# Patient Record
Sex: Male | Born: 1965 | Race: White | Hispanic: No | Marital: Married | Smoking: Former smoker
Health system: Southern US, Community
[De-identification: ages and names within clinical notes are randomized; demographics above are authoritative.]

## PROBLEM LIST (undated history)

## (undated) DIAGNOSIS — N200 Calculus of kidney: Secondary | ICD-10-CM

## (undated) DIAGNOSIS — E119 Type 2 diabetes mellitus without complications: Secondary | ICD-10-CM

## (undated) DIAGNOSIS — K922 Gastrointestinal hemorrhage, unspecified: Secondary | ICD-10-CM

## (undated) DIAGNOSIS — G40909 Epilepsy, unspecified, not intractable, without status epilepticus: Secondary | ICD-10-CM

## (undated) HISTORY — PX: HAND SURGERY: SHX662

## (undated) HISTORY — PX: OTHER SURGICAL HISTORY: SHX169

## (undated) HISTORY — PX: KNEE ARTHROSCOPY: SHX127

---

## 2011-10-01 ENCOUNTER — Emergency Department (HOSPITAL_COMMUNITY)
Admission: EM | Admit: 2011-10-01 | Discharge: 2011-10-01 | Disposition: A | Discharge status: Home / Self Care | Payer: BC Managed Care – PPO | Attending: Emergency Medicine | Admitting: Emergency Medicine

## 2011-10-01 ENCOUNTER — Encounter (HOSPITAL_COMMUNITY): Payer: Self-pay

## 2011-10-01 DIAGNOSIS — M722 Plantar fascial fibromatosis: Secondary | ICD-10-CM | POA: Insufficient documentation

## 2011-10-01 DIAGNOSIS — G40909 Epilepsy, unspecified, not intractable, without status epilepticus: Secondary | ICD-10-CM | POA: Insufficient documentation

## 2011-10-01 HISTORY — DX: Epilepsy, unspecified, not intractable, without status epilepticus: G40.909

## 2011-10-01 NOTE — ED Provider Notes (Signed)
History   This chart was scribed for Hurman Horn, MD scribed by Magnus Sinning. The patient was seen in room STRE6/STRE6 seen at 17:50    CSN: [id number]  Arrival date & time 10/01/11  1653   None     Chief Complaint  Patient presents with  . Foot Pain    (Consider location/radiation/quality/duration/timing/severity/associated sxs/prior treatment) HPI Rodney Steele is a 46 y.o. male who presents to the Emergency Department complaining of localized gradual onset constant left heel foot pain, onset 2 weeks. Pt says the pain is usually initiated after several steps and aggravated when going down steps.  Explains he was mowing the lawn when he felt pain and denies trauma, recent injuries, or previous foot,knee, or calf  pain. Also denies CP, abd pain, back pain, fevers, and n/v/d. NO weak/numb/swell/rash. Past Medical History  Diagnosis Date  . Epilepsy     History reviewed. No pertinent past surgical history.  No family history on file.  History  Substance Use Topics  . Smoking status: Never Smoker   . Smokeless tobacco: Not on file  . Alcohol Use: No      Review of Systems  Constitutional: Negative for fever.       10 Systems reviewed and are negative for acute change except as noted in the HPI.  HENT: Negative for congestion.   Eyes: Negative for discharge and redness.  Respiratory: Negative for cough and shortness of breath.   Cardiovascular: Negative for chest pain.  Gastrointestinal: Negative for vomiting and abdominal pain.  Musculoskeletal: Negative for back pain.       Foot pain  Skin: Negative for rash.  Neurological: Negative for syncope, numbness and headaches.  Psychiatric/Behavioral:       No behavior change.  All other systems reviewed and are negative.    Allergies  Penicillins  Home Medications   Current Outpatient Rx  Name Route Sig Dispense Refill  . BUSPIRONE HCL 10 MG PO TABS Oral Take 10 mg by mouth 2 (two) times daily.    .  TOPIRAMATE 100 MG PO TABS Oral Take 100 mg by mouth 2 (two) times daily.      BP 126/86  Pulse 81  Temp 98.3 F (36.8 C)  SpO2 100%  Physical Exam  Nursing note and vitals reviewed. Constitutional:       Awake, alert, nontoxic appearance.  HENT:  Head: Atraumatic.  Eyes: Right eye exhibits no discharge. Left eye exhibits no discharge.  Neck: Neck supple.  Pulmonary/Chest: Effort normal. He exhibits no tenderness.  Abdominal: Soft. There is no tenderness. There is no rebound.  Musculoskeletal: He exhibits no tenderness.       Baseline ROM, no obvious new focal weakness. Ankle non tender. Achilles tendon and dorsal mid foot are non tender. Along plantar fascia is diffusely tender without overlying erythema or induration.   Neurological:       Mental status and motor strength appears baseline for patient and situation.  Skin: No rash noted.       CR < 2 secs Intact dorsalis pedis.    Psychiatric: He has a normal mood and affect.    ED Course  Procedures (including critical care time) DIAGNOSTIC STUDIES: Oxygen Saturation is 100% on room air, normal by my interpretation.    COORDINATION OF CARE:  Labs Reviewed - No data to display No results found.   1. Plantar fasciitis of left foot       MDM   I personally performed the  services described in this documentation, which was scribed in my presence. The recorded information has been reviewed and considered. Pt stable in ED with no significant deterioration in condition.Patient / Family / Caregiver informed of clinical course, understand medical decision-making process, and agree with plan.I doubt any other EMC precluding discharge at this time including, but not necessarily limited to the following:SBI.     Hurman Horn, MD 10/02/11 [phone]

## 2011-10-01 NOTE — ED Notes (Signed)
Went to find pt. He is In Pod 1 with his wife.

## 2011-10-01 NOTE — ED Notes (Signed)
Lt. Heel pain began about 2 weeks ago, denies any injuries.  Pt. Reports working too many hours.

## 2011-10-01 NOTE — ED Notes (Addendum)
Patient states he started to have pain in his left foot when moving yard and pain is intermittent. 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 Pedal pulse WNL, skin warm and dry to touch. Pain located in arch of left foot.

## 2011-10-01 NOTE — Discharge Instructions (Signed)
Plantar Fasciitis Plantar fasciitis is a common condition that causes foot pain. It is soreness (inflammation) of the band of tough fibrous tissue on the bottom of the foot that runs from the heel bone (calcaneus) to the ball of the foot. The cause of this soreness may be from excessive standing, poor fitting shoes, running on hard surfaces, being overweight, having an abnormal walk, or overuse (this is common in runners) of the painful foot or feet. It is also common in aerobic exercise dancers and ballet dancers. SYMPTOMS  Most people with plantar fasciitis complain of:  Severe pain in the morning on the bottom of their foot especially when taking the first steps out of bed. This pain recedes after a few minutes of walking.   Severe pain is experienced also during walking following a long period of inactivity.   Pain is worse when walking barefoot or up stairs  DIAGNOSIS   Your caregiver will diagnose this condition by examining and feeling your foot.   Special tests such as X-rays of your foot, are usually not needed.  PREVENTION   Consult a sports medicine professional before beginning a new exercise program.   Walking programs offer a good workout. With walking there is a lower chance of overuse injuries common to runners. There is less impact and less jarring of the joints.   Begin all new exercise programs slowly. If problems or pain develop, decrease the amount of time or distance until you are at a comfortable level.   Wear good shoes and replace them regularly.   Stretch your foot and the heel cords at the back of the ankle (Achilles tendon) both before and after exercise.   Run or exercise on even surfaces that are not hard. For example, asphalt is better than pavement.   Do not run barefoot on hard surfaces.   If using a treadmill, vary the incline.   Do not continue to workout if you have foot or joint problems. Seek professional help if they do not improve.  HOME CARE  INSTRUCTIONS   Avoid activities that cause you pain until you recover.   Use ice or cold packs on the problem or painful areas after working out.   Only take over-the-counter or prescription medicines for pain, discomfort, or fever as directed by your caregiver.   Soft shoe inserts or athletic shoes with air or gel sole cushions may be helpful.   If problems continue or become more severe, consult a sports medicine caregiver or your own health care provider. Cortisone is a potent anti-inflammatory medication that may be injected into the painful area. You can discuss this treatment with your caregiver.  MAKE SURE YOU:   Understand these instructions.   Will watch your condition.   Will get help right away if you are not doing well or get worse.  Document Released: 02/01/2001 Document Revised: 04/28/2011 Document Reviewed: 04/02/2008 ExitCare Patient Information 2012 ExitCare,   Return sooner if you develop fever, redness, pus drainage, weakness, numbness, or other concerns.

## 2013-11-05 DIAGNOSIS — E119 Type 2 diabetes mellitus without complications: Secondary | ICD-10-CM

## 2013-11-05 HISTORY — DX: Type 2 diabetes mellitus without complications: E11.9

## 2013-11-06 ENCOUNTER — Emergency Department (HOSPITAL_COMMUNITY): Payer: Self-pay

## 2013-11-06 ENCOUNTER — Inpatient Hospital Stay (HOSPITAL_COMMUNITY)
Admission: EM | Admit: 2013-11-06 | Discharge: 2013-11-08 | DRG: 694 | Disposition: A | Discharge status: Home / Self Care | Payer: BC Managed Care – PPO | Attending: Internal Medicine | Admitting: Internal Medicine

## 2013-11-06 ENCOUNTER — Encounter (HOSPITAL_COMMUNITY): Payer: Self-pay | Admitting: Emergency Medicine

## 2013-11-06 DIAGNOSIS — Z91199 Patient's noncompliance with other medical treatment and regimen due to unspecified reason: Secondary | ICD-10-CM

## 2013-11-06 DIAGNOSIS — Z9119 Patient's noncompliance with other medical treatment and regimen: Secondary | ICD-10-CM

## 2013-11-06 DIAGNOSIS — R739 Hyperglycemia, unspecified: Secondary | ICD-10-CM | POA: Diagnosis present

## 2013-11-06 DIAGNOSIS — K625 Hemorrhage of anus and rectum: Secondary | ICD-10-CM | POA: Diagnosis present

## 2013-11-06 DIAGNOSIS — R569 Unspecified convulsions: Secondary | ICD-10-CM | POA: Diagnosis present

## 2013-11-06 DIAGNOSIS — N189 Chronic kidney disease, unspecified: Secondary | ICD-10-CM | POA: Diagnosis present

## 2013-11-06 DIAGNOSIS — Z5987 Material hardship due to limited financial resources, not elsewhere classified: Secondary | ICD-10-CM

## 2013-11-06 DIAGNOSIS — E119 Type 2 diabetes mellitus without complications: Secondary | ICD-10-CM | POA: Diagnosis present

## 2013-11-06 DIAGNOSIS — Z5309 Procedure and treatment not carried out because of other contraindication: Secondary | ICD-10-CM

## 2013-11-06 DIAGNOSIS — Q638 Other specified congenital malformations of kidney: Secondary | ICD-10-CM

## 2013-11-06 DIAGNOSIS — Z87442 Personal history of urinary calculi: Secondary | ICD-10-CM

## 2013-11-06 DIAGNOSIS — Z598 Other problems related to housing and economic circumstances: Secondary | ICD-10-CM

## 2013-11-06 DIAGNOSIS — Z79899 Other long term (current) drug therapy: Secondary | ICD-10-CM

## 2013-11-06 DIAGNOSIS — N132 Hydronephrosis with renal and ureteral calculous obstruction: Secondary | ICD-10-CM

## 2013-11-06 DIAGNOSIS — N201 Calculus of ureter: Principal | ICD-10-CM | POA: Diagnosis present

## 2013-11-06 DIAGNOSIS — N200 Calculus of kidney: Secondary | ICD-10-CM

## 2013-11-06 DIAGNOSIS — Q631 Lobulated, fused and horseshoe kidney: Secondary | ICD-10-CM

## 2013-11-06 DIAGNOSIS — R7309 Other abnormal glucose: Secondary | ICD-10-CM

## 2013-11-06 DIAGNOSIS — K922 Gastrointestinal hemorrhage, unspecified: Secondary | ICD-10-CM | POA: Diagnosis present

## 2013-11-06 DIAGNOSIS — D696 Thrombocytopenia, unspecified: Secondary | ICD-10-CM | POA: Diagnosis present

## 2013-11-06 DIAGNOSIS — Z87891 Personal history of nicotine dependence: Secondary | ICD-10-CM

## 2013-11-06 DIAGNOSIS — G40909 Epilepsy, unspecified, not intractable, without status epilepticus: Secondary | ICD-10-CM | POA: Diagnosis present

## 2013-11-06 DIAGNOSIS — N1330 Unspecified hydronephrosis: Secondary | ICD-10-CM | POA: Diagnosis present

## 2013-11-06 HISTORY — DX: Calculus of kidney: N20.0

## 2013-11-06 HISTORY — DX: Gastrointestinal hemorrhage, unspecified: K92.2

## 2013-11-06 HISTORY — DX: Type 2 diabetes mellitus without complications: E11.9

## 2013-11-06 LAB — BASIC METABOLIC PANEL
BUN: 18 mg/dL (ref 6–23)
CALCIUM: 8.5 mg/dL (ref 8.4–10.5)
CO2: 18 meq/L — AB (ref 19–32)
CREATININE: 1.16 mg/dL (ref 0.50–1.35)
Chloride: 105 mEq/L (ref 96–112)
GFR calc Af Amer: 86 mL/min — ABNORMAL LOW (ref >90)
GFR calc non Af Amer: 74 mL/min — ABNORMAL LOW (ref >90)
GLUCOSE: 117 mg/dL — AB (ref 70–99)
Potassium: 4.5 mEq/L (ref 3.7–5.3)
Sodium: 139 mEq/L (ref 137–147)

## 2013-11-06 LAB — COMPREHENSIVE METABOLIC PANEL
ALK PHOS: 67 U/L (ref 39–117)
ALT: 53 U/L (ref 0–53)
ALT: 50 U/L (ref 0–53)
AST: 34 U/L (ref 0–37)
AST: 126 U/L — AB (ref 0–37)
Albumin: 4.3 g/dL (ref 3.5–5.2)
Albumin: 3.5 g/dL (ref 3.5–5.2)
Alkaline Phosphatase: 101 U/L (ref 39–117)
BILIRUBIN TOTAL: 0.3 mg/dL (ref 0.3–1.2)
BUN: 17 mg/dL (ref 6–23)
BUN: 8 mg/dL (ref 6–23)
CALCIUM: 9.8 mg/dL (ref 8.4–10.5)
CALCIUM: 8.7 mg/dL (ref 8.4–10.5)
CHLORIDE: 96 meq/L (ref 96–112)
CO2: 26 meq/L (ref 19–32)
CO2: 24 meq/L (ref 19–32)
CREATININE: 1.04 mg/dL (ref 0.50–1.35)
Chloride: 88 mEq/L — ABNORMAL LOW (ref 96–112)
Creatinine, Ser: 0.56 mg/dL (ref 0.50–1.35)
GFR, EST NON AFRICAN AMERICAN: 84 mL/min — AB (ref >90)
GLUCOSE: 312 mg/dL — AB (ref 70–99)
GLUCOSE: 141 mg/dL — AB (ref 70–99)
POTASSIUM: 3.8 meq/L (ref 3.7–5.3)
Potassium: 4.3 mEq/L (ref 3.7–5.3)
SODIUM: 135 meq/L — AB (ref 137–147)
SODIUM: 127 meq/L — AB (ref 137–147)
Total Bilirubin: 1 mg/dL (ref 0.3–1.2)
Total Protein: 8 g/dL (ref 6.0–8.3)
Total Protein: 7.2 g/dL (ref 6.0–8.3)

## 2013-11-06 LAB — I-STAT CHEM 8, ED
BUN: 18 mg/dL (ref 6–23)
CALCIUM ION: 1.2 mmol/L (ref 1.12–1.23)
CHLORIDE: 103 meq/L (ref 96–112)
Creatinine, Ser: 1.2 mg/dL (ref 0.50–1.35)
GLUCOSE: 307 mg/dL — AB (ref 70–99)
HEMATOCRIT: 52 % (ref 39.0–52.0)
HEMOGLOBIN: 17.7 g/dL — AB (ref 13.0–17.0)
Potassium: 3.8 mEq/L (ref 3.7–5.3)
Sodium: 137 mEq/L (ref 137–147)
TCO2: 25 mmol/L (ref 0–100)

## 2013-11-06 LAB — URINALYSIS, ROUTINE W REFLEX MICROSCOPIC
Bilirubin Urine: NEGATIVE
Glucose, UA: >1000 mg/dL — AB
Hgb urine dipstick: NEGATIVE
Ketones, ur: NEGATIVE mg/dL
Leukocytes, UA: NEGATIVE
Nitrite: NEGATIVE
Protein, ur: NEGATIVE mg/dL
Specific Gravity, Urine: 1.035 — ABNORMAL HIGH (ref 1.005–1.030)
Urobilinogen, UA: 0.2 mg/dL (ref 0.0–1.0)
pH: 6 (ref 5.0–8.0)

## 2013-11-06 LAB — GLUCOSE, CAPILLARY
GLUCOSE-CAPILLARY: 253 mg/dL — AB (ref 70–99)
Glucose-Capillary: 118 mg/dL — ABNORMAL HIGH (ref 70–99)
Glucose-Capillary: 124 mg/dL — ABNORMAL HIGH (ref 70–99)
Glucose-Capillary: 156 mg/dL — ABNORMAL HIGH (ref 70–99)
Glucose-Capillary: 173 mg/dL — ABNORMAL HIGH (ref 70–99)

## 2013-11-06 LAB — CBC
HCT: 43.1 % (ref 39.0–52.0)
HCT: 43 % (ref 39.0–52.0)
HEMATOCRIT: 42 % (ref 39.0–52.0)
Hemoglobin: 14.6 g/dL (ref 13.0–17.0)
Hemoglobin: 14.2 g/dL (ref 13.0–17.0)
Hemoglobin: 14.7 g/dL (ref 13.0–17.0)
MCH: 27.4 pg (ref 26.0–34.0)
MCH: 27.4 pg (ref 26.0–34.0)
MCH: 27.6 pg (ref 26.0–34.0)
MCHC: 33.9 g/dL (ref 30.0–36.0)
MCHC: 33.8 g/dL (ref 30.0–36.0)
MCHC: 34.2 g/dL (ref 30.0–36.0)
MCV: 81 fL (ref 78.0–100.0)
MCV: 80.9 fL (ref 78.0–100.0)
MCV: 80.8 fL (ref 78.0–100.0)
PLATELETS: 102 10*3/uL — AB (ref 150–400)
PLATELETS: 106 10*3/uL — AB (ref 150–400)
Platelets: 97 10*3/uL — ABNORMAL LOW (ref 150–400)
RBC: 5.32 MIL/uL (ref 4.22–5.81)
RBC: 5.19 MIL/uL (ref 4.22–5.81)
RBC: 5.32 MIL/uL (ref 4.22–5.81)
RDW: 12.7 % (ref 11.5–15.5)
RDW: 12.9 % (ref 11.5–15.5)
RDW: 12.9 % (ref 11.5–15.5)
WBC: 7.1 10*3/uL (ref 4.0–10.5)
WBC: 7.6 10*3/uL (ref 4.0–10.5)
WBC: 8 10*3/uL (ref 4.0–10.5)

## 2013-11-06 LAB — CBC WITH DIFFERENTIAL/PLATELET
BASOS ABS: 0 10*3/uL (ref 0.0–0.1)
Basophils Relative: 0 % (ref 0–1)
EOS PCT: 1 % (ref 0–5)
Eosinophils Absolute: 0.1 10*3/uL (ref 0.0–0.7)
HEMATOCRIT: 46.8 % (ref 39.0–52.0)
Hemoglobin: 16.2 g/dL (ref 13.0–17.0)
LYMPHS ABS: 2.6 10*3/uL (ref 0.7–4.0)
LYMPHS PCT: 32 % (ref 12–46)
MCH: 27.9 pg (ref 26.0–34.0)
MCHC: 34.6 g/dL (ref 30.0–36.0)
MCV: 80.7 fL (ref 78.0–100.0)
MONO ABS: 0.6 10*3/uL (ref 0.1–1.0)
Monocytes Relative: 7 % (ref 3–12)
NEUTROS ABS: 4.8 10*3/uL (ref 1.7–7.7)
Neutrophils Relative %: 60 % (ref 43–77)
Platelets: 120 10*3/uL — ABNORMAL LOW (ref 150–400)
RBC: 5.8 MIL/uL (ref 4.22–5.81)
RDW: 12.7 % (ref 11.5–15.5)
WBC: 8.1 10*3/uL (ref 4.0–10.5)

## 2013-11-06 LAB — HEMOGLOBIN A1C
Hgb A1c MFr Bld: 9.3 % — ABNORMAL HIGH (ref <5.7)
Mean Plasma Glucose: 220 mg/dL — ABNORMAL HIGH (ref <117)

## 2013-11-06 LAB — URINE MICROSCOPIC-ADD ON

## 2013-11-06 LAB — SAMPLE TO BLOOD BANK

## 2013-11-06 LAB — TYPE AND SCREEN
ABO/RH(D): O POS
Antibody Screen: NEGATIVE

## 2013-11-06 LAB — ABO/RH: ABO/RH(D): O POS

## 2013-11-06 LAB — PROTIME-INR
INR: 0.91 (ref 0.00–1.49)
PROTHROMBIN TIME: 12.1 s (ref 11.6–15.2)

## 2013-11-06 LAB — CBG MONITORING, ED: Glucose-Capillary: 254 mg/dL — ABNORMAL HIGH (ref 70–99)

## 2013-11-06 LAB — POC OCCULT BLOOD, ED: Fecal Occult Bld: POSITIVE — AB

## 2013-11-06 MED ORDER — KETOROLAC TROMETHAMINE 15 MG/ML IJ SOLN
15.0000 mg | Freq: Four times a day (QID) | INTRAMUSCULAR | Status: DC | PRN
  Administered 2013-11-06 (×2): 15 mg via INTRAVENOUS
  Filled 2013-11-06 (×3): qty 1

## 2013-11-06 MED ORDER — ONDANSETRON HCL 4 MG/2ML IJ SOLN
4.0000 mg | Freq: Four times a day (QID) | INTRAMUSCULAR | Status: DC | PRN
  Administered 2013-11-06: 4 mg via INTRAVENOUS

## 2013-11-06 MED ORDER — SODIUM CHLORIDE 0.9 % IV SOLN
500.0000 mg | Freq: Two times a day (BID) | INTRAVENOUS | Status: DC
  Administered 2013-11-06 – 2013-11-07 (×2): 500 mg via INTRAVENOUS
  Filled 2013-11-06 (×3): qty 5

## 2013-11-06 MED ORDER — MORPHINE SULFATE 2 MG/ML IJ SOLN
2.0000 mg | INTRAMUSCULAR | Status: DC | PRN
  Administered 2013-11-06 – 2013-11-07 (×4): 2 mg via INTRAVENOUS
  Filled 2013-11-06 (×5): qty 1

## 2013-11-06 MED ORDER — PEG 3350-KCL-NA BICARB-NACL 420 G PO SOLR
4000.0000 mL | Freq: Once | ORAL | Status: AC
  Administered 2013-11-06: 4000 mL via ORAL
  Filled 2013-11-06 (×2): qty 4000

## 2013-11-06 MED ORDER — SODIUM CHLORIDE 0.9 % IV BOLUS (SEPSIS)
1000.0000 mL | Freq: Once | INTRAVENOUS | Status: AC
  Administered 2013-11-06: 1000 mL via INTRAVENOUS

## 2013-11-06 MED ORDER — DEXTROSE 5 % IV SOLN
1.0000 g | Freq: Two times a day (BID) | INTRAVENOUS | Status: DC
  Administered 2013-11-06 – 2013-11-07 (×4): 1 g via INTRAVENOUS
  Filled 2013-11-06 (×6): qty 1

## 2013-11-06 MED ORDER — MORPHINE SULFATE 4 MG/ML IJ SOLN
4.0000 mg | Freq: Once | INTRAMUSCULAR | Status: AC
  Administered 2013-11-06: 4 mg via INTRAVENOUS
  Filled 2013-11-06: qty 1

## 2013-11-06 MED ORDER — FENTANYL CITRATE 0.05 MG/ML IJ SOLN
100.0000 ug | Freq: Once | INTRAMUSCULAR | Status: AC
  Administered 2013-11-06: 100 ug via INTRAVENOUS
  Filled 2013-11-06: qty 2

## 2013-11-06 MED ORDER — FENTANYL CITRATE 0.05 MG/ML IJ SOLN
50.0000 ug | Freq: Once | INTRAMUSCULAR | Status: AC
  Administered 2013-11-06: 50 ug via INTRAVENOUS
  Filled 2013-11-06: qty 2

## 2013-11-06 MED ORDER — ONDANSETRON HCL 4 MG/2ML IJ SOLN
4.0000 mg | Freq: Once | INTRAMUSCULAR | Status: AC
  Administered 2013-11-06: 4 mg via INTRAVENOUS
  Filled 2013-11-06: qty 2

## 2013-11-06 MED ORDER — LEVETIRACETAM 500 MG/5ML IV SOLN
1000.0000 mg | Freq: Once | INTRAVENOUS | Status: AC
  Administered 2013-11-06: 1000 mg via INTRAVENOUS
  Filled 2013-11-06: qty 10

## 2013-11-06 MED ORDER — ONDANSETRON HCL 4 MG PO TABS: 4.0000 mg | ORAL_TABLET | Freq: Four times a day (QID) | ORAL | Status: DC | PRN

## 2013-11-06 MED ORDER — ACETAMINOPHEN 650 MG RE SUPP: 650.0000 mg | Freq: Four times a day (QID) | RECTAL | Status: DC | PRN

## 2013-11-06 MED ORDER — CEFTRIAXONE SODIUM 1 G IJ SOLR
1.0000 g | Freq: Once | INTRAMUSCULAR | Status: AC
Start: 2013-11-06 — End: 2013-11-06
  Administered 2013-11-06: 1 g via INTRAVENOUS
  Filled 2013-11-06: qty 10

## 2013-11-06 MED ORDER — IOHEXOL 300 MG/ML  SOLN
100.0000 mL | Freq: Once | INTRAMUSCULAR | Status: AC | PRN
  Administered 2013-11-06: 100 mL via INTRAVENOUS

## 2013-11-06 MED ORDER — KETOROLAC TROMETHAMINE 30 MG/ML IJ SOLN
30.0000 mg | Freq: Once | INTRAMUSCULAR | Status: AC
  Administered 2013-11-06: 30 mg via INTRAVENOUS
  Filled 2013-11-06: qty 1

## 2013-11-06 MED ORDER — PANTOPRAZOLE SODIUM 40 MG IV SOLR
40.0000 mg | Freq: Two times a day (BID) | INTRAVENOUS | Status: DC
Start: 2013-11-06 — End: 2013-11-07
  Administered 2013-11-06 – 2013-11-07 (×3): 40 mg via INTRAVENOUS
  Filled 2013-11-06 (×4): qty 40

## 2013-11-06 MED ORDER — ACETAMINOPHEN 325 MG PO TABS
650.0000 mg | ORAL_TABLET | Freq: Four times a day (QID) | ORAL | Status: DC | PRN
  Filled 2013-11-06: qty 2

## 2013-11-06 MED ORDER — SODIUM CHLORIDE 0.9 % IV SOLN
INTRAVENOUS | Status: AC
  Administered 2013-11-06: 100 mL/h via INTRAVENOUS

## 2013-11-06 MED ORDER — INSULIN ASPART 100 UNIT/ML ~~LOC~~ SOLN
0.0000 [IU] | Freq: Three times a day (TID) | SUBCUTANEOUS | Status: DC
  Administered 2013-11-06: 2 [IU] via SUBCUTANEOUS
  Administered 2013-11-06: 1 [IU] via SUBCUTANEOUS
  Administered 2013-11-06: 5 [IU] via SUBCUTANEOUS
  Administered 2013-11-07: 2 [IU] via SUBCUTANEOUS
  Administered 2013-11-07: 1 [IU] via SUBCUTANEOUS

## 2013-11-06 MED ORDER — SODIUM CHLORIDE 0.9 % IV SOLN: INTRAVENOUS | Status: DC

## 2013-11-06 NOTE — Progress Notes (Signed)
Patient admitted early this AM by Dr. Kirtland BouchardK. Please see H7P.  Await GI eval, urology has seen: Pt will probably need JJ stenting of the Left ureter and kidney Patient with no complaints Currently NPO until we know plan Marlin CanaryJessica Vann DO

## 2013-11-06 NOTE — ED Notes (Signed)
Dr. Nicanor AlconPalumbo explained results of tests and plan of care to pt. and family.

## 2013-11-06 NOTE — ED Provider Notes (Signed)
CSN: [id number]     Arrival date & time 11/06/13  0130 History   First MD Initiated Contact with Patient 11/06/13 0211     Chief Complaint  Patient presents with  . Abdominal Pain  . Rectal Bleeding  . Emesis     (Consider location/radiation/quality/duration/timing/severity/associated sxs/prior Treatment) Patient is a 48 y.o. male presenting with abdominal pain, hematochezia, and vomiting. The history is provided by the patient.  Abdominal Pain Pain location:  L flank Pain quality: aching   Pain radiates to:  Does not radiate Pain severity:  Severe Onset quality:  Sudden Timing:  Constant Progression:  Unchanged Chronicity:  New Context: not alcohol use   Relieved by:  Nothing Worsened by:  Nothing tried Ineffective treatments:  None tried Associated symptoms: hematochezia and vomiting   Associated symptoms: no chest pain, no constipation, no nausea and no shortness of breath   Risk factors: no alcohol abuse   Rectal Bleeding Associated symptoms: abdominal pain and vomiting   Emesis Associated symptoms: abdominal pain     Past Medical History  Diagnosis Date  . Epilepsy   . Kidney stones    History reviewed. No pertinent past surgical history. History reviewed. No pertinent family history. History  Substance Use Topics  . Smoking status: Never Smoker   . Smokeless tobacco: Not on file  . Alcohol Use: No    Review of Systems  Respiratory: Negative for shortness of breath.   Cardiovascular: Negative for chest pain.  Gastrointestinal: Positive for vomiting, abdominal pain, hematochezia and anal bleeding. Negative for nausea and constipation.  All other systems reviewed and are negative.     Allergies  Penicillins  Home Medications   Prior to Admission medications   Medication Sig Start Date End Date Taking? Authorizing Provider  busPIRone (BUSPAR) 10 MG tablet Take 10 mg by mouth 2 (two) times daily.    Historical Provider, MD  topiramate (TOPAMAX) 100  MG tablet Take 100 mg by mouth 2 (two) times daily.    Historical Provider, MD   BP 148/66  Pulse 86  Temp(Src) 99.4 F (37.4 C) (Oral)  Resp 17  SpO2 96% Physical Exam  Constitutional: He is oriented to person, place, and time. He appears well-developed and well-nourished. No distress.  HENT:  Head: Normocephalic and atraumatic.  Mouth/Throat: Oropharynx is clear and moist.  Eyes: Conjunctivae are normal. Pupils are equal, round, and reactive to light.  Neck: Normal range of motion. Neck supple.  Cardiovascular: Normal rate, regular rhythm and intact distal pulses.   Pulmonary/Chest: Effort normal and breath sounds normal. No stridor. He has no wheezes. He has no rales.  Abdominal: Soft. Bowel sounds are normal. He exhibits no ascites. There is generalized tenderness. There is no rigidity, no rebound, no guarding, no tenderness at McBurney's point and negative Murphy's sign.  Genitourinary:  Red blood per rectum  Musculoskeletal: Normal range of motion.  Neurological: He is alert and oriented to person, place, and time.  Skin: Skin is warm and dry.  Psychiatric: He has a normal mood and affect.    ED Course  Procedures (including critical care time) Labs Review Labs Reviewed  CBC WITH DIFFERENTIAL - Abnormal; Notable for the following:    Platelets 120 (*)    All other components within normal limits  I-STAT CHEM 8, ED - Abnormal; Notable for the following:    Glucose, Bld 307 (*)    Hemoglobin 17.7 (*)    All other components within normal limits  COMPREHENSIVE METABOLIC PANEL  PROTIME-INR  URINALYSIS, ROUTINE W REFLEX MICROSCOPIC  SAMPLE TO BLOOD BANK    Imaging Review No results found.   EKG Interpretation None      MDM   Final diagnoses:  None   Kidney stone New onset DM Horseshoe kidney Rectal bleeding  Case d/w Dr. Patsi Searsannenbaum who will see the patient admit to medicine  Medications  levETIRAcetam (KEPPRA) 1,000 mg in sodium chloride 0.9 % 100 mL  IVPB (not administered)  fentaNYL (SUBLIMAZE) injection 50 mcg (50 mcg Intravenous Given 11/06/13 0202)  ondansetron (ZOFRAN) injection 4 mg (4 mg Intravenous Given 11/06/13 0202)  sodium chloride 0.9 % bolus 1,000 mL (0 mLs Intravenous Stopped 11/06/13 0351)  fentaNYL (SUBLIMAZE) injection 100 mcg (100 mcg Intravenous Given 11/06/13 0243)  morphine 4 MG/ML injection 4 mg (4 mg Intravenous Given 11/06/13 0339)  iohexol (OMNIPAQUE) 300 MG/ML solution 100 mL (100 mLs Intravenous Contrast Given 11/06/13 0440)  ketorolac (TORADOL) 30 MG/ML injection 30 mg (30 mg Intravenous Given 11/06/13 0535)     April K Palumbo-Rasch, MD 11/06/13 [phone]

## 2013-11-06 NOTE — Consult Note (Signed)
Urology Consult  Referring physician: Dr. 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 several years ago, now presents to [facility] with acute L flank pain beginning at 11pm last night, associated with nausea and vomiting. Pain was also nted in the L testicle. Pt noted bloody effluent from the rectum, and has been evaluated in [facility] ED, with CT showing Horshoe kidney, with 77mm L UPJ stone, as well as multiple LLP non-obstructing stones. His pain is now controlled, and he is admitted for further evaluation of his rectal bleeding.    Drinks occasional soda. Daily fluids: water and milk. Negative family history for stones.   Past Medical History  Diagnosis Date  . Epilepsy   . Kidney stones    Past Surgical History  Procedure Laterality Date  . Hand surgery      Medications: I have reviewed the patient's current medications.  Allergies:  Allergies  Allergen Reactions  . Penicillins Rash    History reviewed. No pertinent family history.  Social History:  reports that he has never smoked. He does not have any smokeless tobacco history on file. He reports that he does not drink alcohol or use illicit drugs.  ROS: Abdominal Pain  Pain location: L flank Pain quality: aching  Pain radiates to: Does not radiate  Pain severity: Severe  Onset quality: Sudden  Timing: Constant  Progression: Unchanged  Chronicity: New Context: not alcohol use  Relieved by: Nothing  Worsened by: Nothing tried  Ineffective treatments: None tried Associated symptoms: hematochezia and vomiting  Associated symptoms: no chest pain, no constipation, no nausea and no shortness of breath     Physical Exam:  Vital signs in last 24 hours: Temp:  [98.2 F (36.8 C)-99.4 F (37.4 C)] 99.4 F (37.4 C) (06/17 0154) Pulse Rate:  [79-89] 89 (06/17 0643) Resp:  [14-20] 14  (06/17 0643) BP: (119-155)/(66-89) 119/75 mmHg (06/17 0643) SpO2:  [89 %-100 %] 100 % (06/17 0454) Physical Exam: ABD: Obese. Decrease BS. Mild L CVA pain. No LLQ pain. Testes: Descended bilaterally. Non tender. Chest: Cleat to P/A.   Laboratory Data:  Results for orders placed during the hospital encounter of 11/06/13 (from the past 72 hour(s))  CBC WITH DIFFERENTIAL     Status: Abnormal   Collection Time    11/06/13  2:00 AM      Result Value Ref Range   WBC 8.1  4.0 - 10.5 K/uL   RBC 5.80  4.22 - 5.81 MIL/uL   Hemoglobin 16.2  13.0 - 17.0 g/dL   HCT 46.8  39.0 - 52.0 %   MCV 80.7  78.0 - 100.0 fL   MCH 27.9  26.0 - 34.0 pg   MCHC 34.6  30.0 - 36.0 g/dL   RDW 12.7  11.5 - 15.5 %   Platelets 120 (*) 150 - 400 K/uL   Neutrophils Relative % 60  43 - 77 %   Neutro Abs 4.8  1.7 - 7.7 K/uL   Lymphocytes Relative 32  12 - 46 %   Lymphs Abs 2.6  0.7 - 4.0 K/uL   Monocytes Relative 7  3 - 12 %   Monocytes Absolute 0.6  0.1 - 1.0 K/uL   Eosinophils Relative 1  0 - 5 %   Eosinophils Absolute 0.1  0.0 - 0.7 K/uL   Basophils Relative 0  0 -  1 %   Basophils Absolute 0.0  0.0 - 0.1 K/uL  COMPREHENSIVE METABOLIC PANEL     Status: Abnormal   Collection Time    11/06/13  2:00 AM      Result Value Ref Range   Sodium 135 (*) 137 - 147 mEq/L   Potassium 4.3  3.7 - 5.3 mEq/L   Comment: HEMOLYSIS AT THIS LEVEL MAY AFFECT RESULT   Chloride 96  96 - 112 mEq/L   CO2 26  19 - 32 mEq/L   Glucose, Bld 312 (*) 70 - 99 mg/dL   BUN 17  6 - 23 mg/dL   Creatinine, Ser 1.04  0.50 - 1.35 mg/dL   Calcium 9.8  8.4 - 10.5 mg/dL   Total Protein 8.0  6.0 - 8.3 g/dL   Albumin 4.3  3.5 - 5.2 g/dL   AST 34  0 - 37 U/L   ALT 53  0 - 53 U/L   Alkaline Phosphatase 101  39 - 117 U/L   Total Bilirubin 0.3  0.3 - 1.2 mg/dL   GFR calc non Af Amer 84 (*) >90 mL/min   GFR calc Af Amer >90  >90 mL/min   Comment: (NOTE)     The eGFR has been calculated using the CKD EPI equation.     This calculation has not been  validated in all clinical situations.     eGFR's persistently <90 mL/min signify possible Chronic Kidney     Disease.  PROTIME-INR     Status: None   Collection Time    11/06/13  2:00 AM      Result Value Ref Range   Prothrombin Time 12.1  11.6 - 15.2 seconds   INR 0.91  0.00 - 1.49  SAMPLE TO BLOOD BANK     Status: None   Collection Time    11/06/13  2:17 AM      Result Value Ref Range   Blood Bank Specimen SAMPLE AVAILABLE FOR TESTING     Sample Expiration 11/07/2013    I-STAT CHEM 8, ED     Status: Abnormal   Collection Time    11/06/13  2:19 AM      Result Value Ref Range   Sodium 137  137 - 147 mEq/L   Potassium 3.8  3.7 - 5.3 mEq/L   Chloride 103  96 - 112 mEq/L   BUN 18  6 - 23 mg/dL   Creatinine, Ser 1.20  0.50 - 1.35 mg/dL   Glucose, Bld 307 (*) 70 - 99 mg/dL   Calcium, Ion 1.20  1.12 - 1.23 mmol/L   TCO2 25  0 - 100 mmol/L   Hemoglobin 17.7 (*) 13.0 - 17.0 g/dL   HCT 52.0  39.0 - 52.0 %  URINALYSIS, ROUTINE W REFLEX MICROSCOPIC     Status: Abnormal   Collection Time    11/06/13  2:36 AM      Result Value Ref Range   Color, Urine YELLOW  YELLOW   APPearance CLEAR  CLEAR   Specific Gravity, Urine 1.035 (*) 1.005 - 1.030   pH 6.0  5.0 - 8.0   Glucose, UA >1000 (*) NEGATIVE mg/dL   Hgb urine dipstick NEGATIVE  NEGATIVE   Bilirubin Urine NEGATIVE  NEGATIVE   Ketones, ur NEGATIVE  NEGATIVE mg/dL   Protein, ur NEGATIVE  NEGATIVE mg/dL   Urobilinogen, UA 0.2  0.0 - 1.0 mg/dL   Nitrite NEGATIVE  NEGATIVE   Leukocytes, UA NEGATIVE  NEGATIVE  URINE  MICROSCOPIC-ADD ON     Status: None   Collection Time    11/06/13  2:36 AM      Result Value Ref Range   Squamous Epithelial / LPF RARE  RARE   Bacteria, UA RARE  RARE  POC OCCULT BLOOD, ED     Status: Abnormal   Collection Time    11/06/13  2:50 AM      Result Value Ref Range   Fecal Occult Bld POSITIVE (*) NEGATIVE   No results found for this or any previous visit (from the past 240 hour(s)). Creatinine:  Recent  Labs  11/06/13 0200 11/06/13 0219  CREATININE 1.04 1.20   CT SCAN:  CLINICAL DATA: Left lower back pain, radiating to the left lower  quadrant of the abdomen. Vomiting and bloody stools.  EXAM:  CT ABDOMEN AND PELVIS WITH CONTRAST  TECHNIQUE:  Multidetector CT imaging of the abdomen and pelvis was performed  using the standard protocol following bolus administration of  intravenous contrast.  CONTRAST: 170mL OMNIPAQUE IOHEXOL 300 MG/ML SOLN  COMPARISON: None.  FINDINGS:  Minimal bibasilar atelectasis is noted.  The liver and spleen are unremarkable in appearance. The gallbladder  is within normal limits. The pancreas and adrenal glands are  unremarkable.  A horseshoe kidney is noted. There is moderate hydronephrosis  involving the left side of the horseshoe kidney, with an obstructing  large 10 x 7 mm stone at the ureteropelvic junction, adjacent to the  left renal pelvis. Multiple smaller nonobstructing stones are noted  at the lower pole of the left side of the horseshoe kidney,  measuring up to 8 mm in size.  The more distal ureters are decompressed; the right side of the  horseshoe kidney is unremarkable in appearance. Left-sided  perinephric stranding and fluid are seen.  No free fluid is identified. The small bowel is unremarkable in  appearance. The stomach is within normal limits. No acute vascular  abnormalities are seen.  The appendix is normal in caliber, without evidence for  appendicitis. The colon is relatively decompressed and unremarkable  appearance.  The bladder is mildly distended and grossly unremarkable. The  prostate remains normal in size. No inguinal lymphadenopathy is  seen.  No acute osseous abnormalities are identified. Vacuum phenomenon is  noted at L5-S1.  IMPRESSION:  1. Moderate hydronephrosis involving the left side of the patient's  horseshoe kidney, with an obstructing large 10 x 7 mm stone at the  ureteropelvic junction, adjacent to the  left renal pelvis.  Left-sided perinephric stranding and fluid seen.  2. Multiple smaller nonobstructing stones at the lower pole of the  left side of the horseshoe kidney, measuring up to 8 mm in size.  Electronically Signed  By: Garald Balding M.D.  On: 11/06/2013 05:26  Impression/Assessment:  1. Horsehoe Kidney 2. 100 L UPJ stone with L renal pelvic distention but no caliectasis 3. LLP stones 4. Obesity 5. Glycosuria-new and undiagnosed. ? Diabetes. 6. Hx of controlled epilepsy 7. Cr. 1.2, gfr=84: reduced renal function. ? ckd   Plan:   Medicine admit and evaluation Pt will probably need JJ stenting of the Left ureter and kidney. I will have on-call Urologist to evaluate today. Advise ck serum uric acid next draw. ( will eventually need 24 hr urine for stone disease). Toradol could help with pain, but should not be given if he has renal disease-possible reduced dose ( 1/2).   TANNENBAUM, SIGMUND I 11/06/2013, 6:55 AM

## 2013-11-06 NOTE — H&P (Signed)
[facility] History and Physical  Rodney Steele ZOX:[id number] DOB: 1965/07/24 DOA: 11/06/2013  Referring physician: ER physician. PCP: Pcp Not In System   Chief Complaint: Abdominal pain and rectal bleeding.  HPI: Rodney Steele is a 48 y.o. male with history of seizures and nephrolithiasis started experiencing pain in the left lower quadrant and flank since last night 11 PM. The pain was constant with some nausea vomiting. Patient experienced at least 3 episodes of frank rectal bleeding spontaneously. Had some chills and rigors. Denies any chest pain or shortness of breath. In the ER stool for occult blood was positive. 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 On-call urologist Dr. Patsi Sears was consulted by the ED physician. Dr. Patsi Sears will be seeing patient in consult. Presently patient is hemodynamically stable. Patient has not had any further episodes of rectal bleeding. In addition patient was found to have hyperglycemia which is new. Patient has history of seizures and has not been taking his medications for last 5 months because of financial issues and ED physician has loaded patient with Keppra.   Review of Systems: As presented in the history of presenting illness, rest negative.  Past Medical History  Diagnosis Date  . Epilepsy   . Kidney stones    Past Surgical History  Procedure Laterality Date  . Hand surgery     Social History:  reports that he has never smoked. He does not have any smokeless tobacco history on file. He reports that he does not drink alcohol or use illicit drugs. Where does patient live home. Can patient participate in ADLs? Yes..  Allergies  Allergen Reactions  . Penicillins Rash    Family History: History reviewed. No pertinent family history.    Prior to Admission medications   Medication Sig Start Date End Date Taking? Authorizing Provider  busPIRone (BUSPAR) 10 MG tablet Take 10 mg by mouth  daily as needed (jittery).    Yes Historical Provider, MD  topiramate (TOPAMAX) 100 MG tablet Take 100 mg by mouth daily as needed (epilepsy).    Yes Historical Provider, MD    Physical Exam: Filed Vitals:   11/06/13 0300 11/06/13 0336 11/06/13 0505 11/06/13 0643  BP: 144/86 143/78 155/85 119/75  Pulse: 82 79 81 89  Temp:      TempSrc:      Resp: 14 14 14 14   SpO2: 95% 98% 97% 100%     General:  Well-developed well-nourished.  Eyes: Anicteric no pallor.  ENT: No discharge from the ears eyes nose mouth.  Neck: No mass felt.  Cardiovascular: S1-S2 heard.  Respiratory: No rhonchi crepitations.  Abdomen: Soft nontender bowel sounds present. No guarding rigidity.  Skin: No rash.  Musculoskeletal: No edema.  Psychiatric: Appears normal.  Neurologic: Alert and oriented to time place and person. Moves all extremities.  Labs on Admission:  Basic Metabolic Panel:  Recent Labs Lab 11/06/13 0200 11/06/13 0219  NA 135* 137  K 4.3 3.8  CL 96 103  CO2 26  --   GLUCOSE 312* 307*  BUN 17 18  CREATININE 1.04 1.20  CALCIUM 9.8  --    Liver Function Tests:  Recent Labs Lab 11/06/13 0200  AST 34  ALT 53  ALKPHOS 101  BILITOT 0.3  PROT 8.0  ALBUMIN 4.3   No results found for this basename: LIPASE, AMYLASE,  in the last 168 hours No results found for this basename: AMMONIA,  in the last 168 hours CBC:  Recent  Labs Lab 11/06/13 0200 11/06/13 0219  WBC 8.1  --   NEUTROABS 4.8  --   HGB 16.2 17.7*  HCT 46.8 52.0  MCV 80.7  --   PLT 120*  --    Cardiac Enzymes: No results found for this basename: CKTOTAL, CKMB, CKMBINDEX, TROPONINI,  in the last 168 hours  BNP (last 3 results) No results found for this basename: PROBNP,  in the last 8760 hours CBG: No results found for this basename: GLUCAP,  in the last 168 hours  Radiological Exams on Admission: Ct Abdomen Pelvis W Contrast  11/06/2013   CLINICAL DATA:  Left lower back pain, radiating to the left  lower quadrant of the abdomen. Vomiting and bloody stools.  EXAM: CT ABDOMEN AND PELVIS WITH CONTRAST  TECHNIQUE: Multidetector CT imaging of the abdomen and pelvis was performed using the standard protocol following bolus administration of intravenous contrast.  CONTRAST:  100mL OMNIPAQUE IOHEXOL 300 MG/ML  SOLN  COMPARISON:  None.  FINDINGS: Minimal bibasilar atelectasis is noted.  The liver and spleen are unremarkable in appearance. The gallbladder is within normal limits. The pancreas and adrenal glands are unremarkable.  A horseshoe kidney is noted. There is moderate hydronephrosis involving the left side of the horseshoe kidney, with an obstructing large 10 x 7 mm stone at the ureteropelvic junction, adjacent to the left renal pelvis. Multiple smaller nonobstructing stones are noted at the lower pole of the left side of the horseshoe kidney, measuring up to 8 mm in size.  The more distal ureters are decompressed; the right side of the horseshoe kidney is unremarkable in appearance. Left-sided perinephric stranding and fluid are seen.  No free fluid is identified. The small bowel is unremarkable in appearance. The stomach is within normal limits. No acute vascular abnormalities are seen.  The appendix is normal in caliber, without evidence for appendicitis. The colon is relatively decompressed and unremarkable appearance.  The bladder is mildly distended and grossly unremarkable. The prostate remains normal in size. No inguinal lymphadenopathy is seen.  No acute osseous abnormalities are identified. Vacuum phenomenon is noted at L5-S1.  IMPRESSION: 1. Moderate hydronephrosis involving the left side of the patient's horseshoe kidney, with an obstructing large 10 x 7 mm stone at the ureteropelvic junction, adjacent to the left renal pelvis. Left-sided perinephric stranding and fluid seen. 2. Multiple smaller nonobstructing stones at the lower pole of the left side of the horseshoe kidney, measuring up to 8 mm in  size.   Electronically Signed   By: Roanna RaiderJeffery  Chang M.D.   On: 11/06/2013 05:26     Assessment/Plan Principal Problem:   Rectal bleed Active Problems:   Hydronephrosis   Hyperglycemia   Seizures   GI bleed   1. Rectal bleeding - presently hemodynamically stable. Closely follow CBC. Most likely lower GI bleed. I have discussed with on-call gastroenterologist Dr. Elnoria HowardHung who will be seeing patient in consult. 2. Moderate hydronephrosis of the left side of the horseshoe kidney - since patient has perinephric stranding empiric antibiotics has been placed. Dr. Patsi Searsannenbaum, urologist, to see patient in consult. 3. Hyperglycemia - check hemoglobin A1c. For now keep patient on sliding-scale coverage. 4. Elevated blood pressure - closely follow blood pressure trends. 5. History of seizures - usually takes Topamax but has not taken due to financial issues and presently on IV Keppra. 6. Thrombocytopenia - closely follow CBC for any further worsening.    Code Status: Full code.  Family Communication: Patient's wife at the bedside.  Disposition Plan: Admit to inpatient.    KAKRAKANDY,ARSHAD N. [facility] Pager [phone].  If 7PM-7AM, please contact night-coverage [email or web address] Password [facility] 11/06/2013, 6:51 AM

## 2013-11-06 NOTE — Consult Note (Signed)
Unassigned patient Reason for Consult: Rectal bleeding/LLQ pain. Referring Physician: THP.  Rodney Steele is an 48 y.o. male.  HPI: 48 year old, morbidly obese, white male, with multiple medical issues listed below, presented to the ER with severe LLQ pain and 3 episodes of rectal bleeding last night that prompted him to come to the ER-he has not been taking any of his medications as has been facing some financial hardship. He denies any previous history of GI bleeding. There is no history of constipation , diarrhea; no knwn history of diverticulosis. There is no history of NSAID use or abuse. He denies a family history of colon cancer. He also has nephrolithiasis and may require stenting.     Past Medical History  Diagnosis Date  . Epilepsy   . Kidney stones   . GI bleed 11/06/2013  . Diabetes mellitus without complication 1/69/6789    NEW DIAGNOSIS   Past Surgical History  Procedure Laterality Date  . Hand surgery     History reviewed. No pertinent family history.  Social History:  reports that he quit smoking about 17 years ago. He has never used smokeless tobacco. He reports that he does not drink alcohol or use illicit drugs.  Allergies:  Allergies  Allergen Reactions  . Penicillins Rash   Medications: I have reviewed the patient's current medications.  Results for orders placed during the hospital encounter of 11/06/13 (from the past 48 hour(s))  CBC WITH DIFFERENTIAL     Status: Abnormal   Collection Time    11/06/13  2:00 AM      Result Value Ref Range   WBC 8.1  4.0 - 10.5 K/uL   RBC 5.80  4.22 - 5.81 MIL/uL   Hemoglobin 16.2  13.0 - 17.0 g/dL   HCT 46.8  39.0 - 52.0 %   MCV 80.7  78.0 - 100.0 fL   MCH 27.9  26.0 - 34.0 pg   MCHC 34.6  30.0 - 36.0 g/dL   RDW 12.7  11.5 - 15.5 %   Platelets 120 (*) 150 - 400 K/uL   Neutrophils Relative % 60  43 - 77 %   Neutro Abs 4.8  1.7 - 7.7 K/uL   Lymphocytes Relative 32  12 - 46 %   Lymphs Abs 2.6  0.7 - 4.0 K/uL    Monocytes Relative 7  3 - 12 %   Monocytes Absolute 0.6  0.1 - 1.0 K/uL   Eosinophils Relative 1  0 - 5 %   Eosinophils Absolute 0.1  0.0 - 0.7 K/uL   Basophils Relative 0  0 - 1 %   Basophils Absolute 0.0  0.0 - 0.1 K/uL  COMPREHENSIVE METABOLIC PANEL     Status: Abnormal   Collection Time    11/06/13  2:00 AM      Result Value Ref Range   Sodium 135 (*) 137 - 147 mEq/L   Potassium 4.3  3.7 - 5.3 mEq/L   Comment: HEMOLYSIS AT THIS LEVEL MAY AFFECT RESULT   Chloride 96  96 - 112 mEq/L   CO2 26  19 - 32 mEq/L   Glucose, Bld 312 (*) 70 - 99 mg/dL   BUN 17  6 - 23 mg/dL   Creatinine, Ser 1.04  0.50 - 1.35 mg/dL   Calcium 9.8  8.4 - 10.5 mg/dL   Total Protein 8.0  6.0 - 8.3 g/dL   Albumin 4.3  3.5 - 5.2 g/dL   AST 34  0 -  37 U/L   ALT 53  0 - 53 U/L   Alkaline Phosphatase 101  39 - 117 U/L   Total Bilirubin 0.3  0.3 - 1.2 mg/dL   GFR calc non Af Amer 84 (*) >90 mL/min   GFR calc Af Amer >90  >90 mL/min   Comment: (NOTE)     The eGFR has been calculated using the CKD EPI equation.     This calculation has not been validated in all clinical situations.     eGFR's persistently <90 mL/min signify possible Chronic Kidney     Disease.  PROTIME-INR     Status: None   Collection Time    11/06/13  2:00 AM      Result Value Ref Range   Prothrombin Time 12.1  11.6 - 15.2 seconds   INR 0.91  0.00 - 1.49  TYPE AND SCREEN     Status: None   Collection Time    11/06/13  2:00 AM      Result Value Ref Range   ABO/RH(D) O POS     Antibody Screen NEG     Sample Expiration 11/09/2013    ABO/RH     Status: None   Collection Time    11/06/13  2:00 AM      Result Value Ref Range   ABO/RH(D) O POS    SAMPLE TO BLOOD BANK     Status: None   Collection Time    11/06/13  2:17 AM      Result Value Ref Range   Blood Bank Specimen SAMPLE AVAILABLE FOR TESTING     Sample Expiration 11/07/2013    I-STAT CHEM 8, ED     Status: Abnormal   Collection Time    11/06/13  2:19 AM      Result Value  Ref Range   Sodium 137  137 - 147 mEq/L   Potassium 3.8  3.7 - 5.3 mEq/L   Chloride 103  96 - 112 mEq/L   BUN 18  6 - 23 mg/dL   Creatinine, Ser 1.20  0.50 - 1.35 mg/dL   Glucose, Bld 307 (*) 70 - 99 mg/dL   Calcium, Ion 1.20  1.12 - 1.23 mmol/L   TCO2 25  0 - 100 mmol/L   Hemoglobin 17.7 (*) 13.0 - 17.0 g/dL   HCT 52.0  39.0 - 52.0 %  URINALYSIS, ROUTINE W REFLEX MICROSCOPIC     Status: Abnormal   Collection Time    11/06/13  2:36 AM      Result Value Ref Range   Color, Urine YELLOW  YELLOW   APPearance CLEAR  CLEAR   Specific Gravity, Urine 1.035 (*) 1.005 - 1.030   pH 6.0  5.0 - 8.0   Glucose, UA >1000 (*) NEGATIVE mg/dL   Hgb urine dipstick NEGATIVE  NEGATIVE   Bilirubin Urine NEGATIVE  NEGATIVE   Ketones, ur NEGATIVE  NEGATIVE mg/dL   Protein, ur NEGATIVE  NEGATIVE mg/dL   Urobilinogen, UA 0.2  0.0 - 1.0 mg/dL   Nitrite NEGATIVE  NEGATIVE   Leukocytes, UA NEGATIVE  NEGATIVE  URINE MICROSCOPIC-ADD ON     Status: None   Collection Time    11/06/13  2:36 AM      Result Value Ref Range   Squamous Epithelial / LPF RARE  RARE   Bacteria, UA RARE  RARE  POC OCCULT BLOOD, ED     Status: Abnormal   Collection Time    11/06/13  2:50 AM  Result Value Ref Range   Fecal Occult Bld POSITIVE (*) NEGATIVE  CBC     Status: Abnormal   Collection Time    11/06/13  6:30 AM      Result Value Ref Range   WBC 7.1  4.0 - 10.5 K/uL   RBC 5.32  4.22 - 5.81 MIL/uL   Hemoglobin 14.6  13.0 - 17.0 g/dL   Comment: REPEATED TO VERIFY   HCT 43.1  39.0 - 52.0 %   MCV 81.0  78.0 - 100.0 fL   MCH 27.4  26.0 - 34.0 pg   MCHC 33.9  30.0 - 36.0 g/dL   RDW 12.7  11.5 - 15.5 %   Platelets 97 (*) 150 - 400 K/uL   Comment: REPEATED TO VERIFY     PLATELET COUNT CONFIRMED BY SMEAR  HEMOGLOBIN A1C     Status: Abnormal   Collection Time    11/06/13  6:30 AM      Result Value Ref Range   Hemoglobin A1C 9.3 (*) <5.7 %   Comment: (NOTE)                                                                                According to the ADA Clinical Practice Recommendations for 2011, when     HbA1c is used as a screening test:      >=6.5%   Diagnostic of Diabetes Mellitus               (if abnormal result is confirmed)     5.7-6.4%   Increased risk of developing Diabetes Mellitus     References:Diagnosis and Classification of Diabetes Mellitus,Diabetes     JKDT,2671,24(PYKDX 1):S62-S69 and Standards of Medical Care in             Diabetes - 2011,Diabetes Care,2011,34 (Suppl 1):S11-S61.   Mean Plasma Glucose 220 (*) <117 mg/dL   Comment: Performed at [facility] MONITORING, ED     Status: Abnormal   Collection Time    11/06/13  6:54 AM      Result Value Ref Range   Glucose-Capillary 254 (*) 70 - 99 mg/dL  COMPREHENSIVE METABOLIC PANEL     Status: Abnormal   Collection Time    11/06/13  7:56 AM      Result Value Ref Range   Sodium 127 (*) 137 - 147 mEq/L   Potassium 3.8  3.7 - 5.3 mEq/L   Chloride 88 (*) 96 - 112 mEq/L   CO2 24  19 - 32 mEq/L   Glucose, Bld 141 (*) 70 - 99 mg/dL   BUN 8  6 - 23 mg/dL   Creatinine, Ser 0.56  0.50 - 1.35 mg/dL   Comment: RESULT CALLED TO, READ BACK BY AND VERIFIED WITH:     Burman Riis AT 8338 11/06/13 BY ZBEECH.   Calcium 8.7  8.4 - 10.5 mg/dL   Total Protein 7.2  6.0 - 8.3 g/dL   Albumin 3.5  3.5 - 5.2 g/dL   AST 126 (*) 0 - 37 U/L   ALT 50  0 - 53 U/L   Alkaline Phosphatase 67  39 - 117 U/L   Total  Bilirubin 1.0  0.3 - 1.2 mg/dL   GFR calc non Af Amer >90  >90 mL/min   GFR calc Af Amer >90  >90 mL/min   Comment: (NOTE)     The eGFR has been calculated using the CKD EPI equation.     This calculation has not been validated in all clinical situations.     eGFR's persistently <90 mL/min signify possible Chronic Kidney     Disease.  GLUCOSE, CAPILLARY     Status: Abnormal   Collection Time    11/06/13  8:04 AM      Result Value Ref Range   Glucose-Capillary 253 (*) 70 - 99 mg/dL  GLUCOSE, CAPILLARY     Status: Abnormal    Collection Time    11/06/13 12:09 PM      Result Value Ref Range   Glucose-Capillary 173 (*) 70 - 99 mg/dL  CBC     Status: Abnormal   Collection Time    11/06/13  2:45 PM      Result Value Ref Range   WBC 7.6  4.0 - 10.5 K/uL   RBC 5.19  4.22 - 5.81 MIL/uL   Hemoglobin 14.2  13.0 - 17.0 g/dL   HCT 42.0  39.0 - 52.0 %   MCV 80.9  78.0 - 100.0 fL   MCH 27.4  26.0 - 34.0 pg   MCHC 33.8  30.0 - 36.0 g/dL   RDW 12.9  11.5 - 15.5 %   Platelets 102 (*) 150 - 400 K/uL   Comment: CONSISTENT WITH PREVIOUS RESULT  BASIC METABOLIC PANEL     Status: Abnormal   Collection Time    11/06/13  4:26 PM      Result Value Ref Range   Sodium 139  137 - 147 mEq/L   Potassium 4.5  3.7 - 5.3 mEq/L   Comment: HEMOLYSIS AT THIS LEVEL MAY AFFECT RESULT   Chloride 105  96 - 112 mEq/L   CO2 18 (*) 19 - 32 mEq/L   Glucose, Bld 117 (*) 70 - 99 mg/dL   BUN 18  6 - 23 mg/dL   Creatinine, Ser 1.16  0.50 - 1.35 mg/dL   Comment: DELTA CHECK NOTED   Calcium 8.5  8.4 - 10.5 mg/dL   GFR calc non Af Amer 74 (*) >90 mL/min   GFR calc Af Amer 86 (*) >90 mL/min   Comment: (NOTE)     The eGFR has been calculated using the CKD EPI equation.     This calculation has not been validated in all clinical situations.     eGFR's persistently <90 mL/min signify possible Chronic Kidney     Disease.  GLUCOSE, CAPILLARY     Status: Abnormal   Collection Time    11/06/13  5:05 PM      Result Value Ref Range   Glucose-Capillary 124 (*) 70 - 99 mg/dL   Ct Abdomen Pelvis W Contrast  11/06/2013   CLINICAL DATA:  Left lower back pain, radiating to the left lower quadrant of the abdomen. Vomiting and bloody stools.  EXAM: CT ABDOMEN AND PELVIS WITH CONTRAST  TECHNIQUE: Multidetector CT imaging of the abdomen and pelvis was performed using the standard protocol following bolus administration of intravenous contrast.  CONTRAST:  140mL OMNIPAQUE IOHEXOL 300 MG/ML  SOLN  COMPARISON:  None.  FINDINGS: Minimal bibasilar atelectasis is  noted.  The liver and spleen are unremarkable in appearance. The gallbladder is within normal limits. The pancreas and adrenal glands  are unremarkable.  A horseshoe kidney is noted. There is moderate hydronephrosis involving the left side of the horseshoe kidney, with an obstructing large 10 x 7 mm stone at the ureteropelvic junction, adjacent to the left renal pelvis. Multiple smaller nonobstructing stones are noted at the lower pole of the left side of the horseshoe kidney, measuring up to 8 mm in size.  The more distal ureters are decompressed; the right side of the horseshoe kidney is unremarkable in appearance. Left-sided perinephric stranding and fluid are seen.  No free fluid is identified. The small bowel is unremarkable in appearance. The stomach is within normal limits. No acute vascular abnormalities are seen.  The appendix is normal in caliber, without evidence for appendicitis. The colon is relatively decompressed and unremarkable appearance.  The bladder is mildly distended and grossly unremarkable. The prostate remains normal in size. No inguinal lymphadenopathy is seen.  No acute osseous abnormalities are identified. Vacuum phenomenon is noted at L5-S1.  IMPRESSION: 1. Moderate hydronephrosis involving the left side of the patient's horseshoe kidney, with an obstructing large 10 x 7 mm stone at the ureteropelvic junction, adjacent to the left renal pelvis. Left-sided perinephric stranding and fluid seen. 2. Multiple smaller nonobstructing stones at the lower pole of the left side of the horseshoe kidney, measuring up to 8 mm in size.   Electronically Signed   By: Garald Balding M.D.   On: 11/06/2013 05:26   Review of Systems  Constitutional: Negative.  Negative for fever, chills, weight loss, malaise/fatigue and diaphoresis.  HENT: Negative.   Eyes: Negative.   Respiratory: Negative.   Cardiovascular: Negative.   Gastrointestinal: Positive for nausea, vomiting, abdominal pain and blood in  stool. Negative for diarrhea and constipation.  Genitourinary: Positive for dysuria and flank pain.  Musculoskeletal: Negative.   Skin: Negative.   Neurological: Negative.  Negative for weakness.  Endo/Heme/Allergies: Negative.   Psychiatric/Behavioral: Negative.    Blood pressure 158/96, pulse 74, temperature 97.3 F (36.3 C), temperature source Oral, resp. rate 18, height $RemoveBe'5\' 11"'NHxteBPfx$  (1.803 m), weight 140.615 kg (310 lb), SpO2 97.00%. Physical Exam  Constitutional: He is oriented to person, place, and time. He appears well-developed and well-nourished.  HENT:  Head: Normocephalic and atraumatic.  Eyes: Conjunctivae and EOM are normal. Pupils are equal, round, and reactive to light.  Neck: Normal range of motion. Neck supple.  Cardiovascular: Normal rate and regular rhythm.   Respiratory: Effort normal.  GI: Soft. Bowel sounds are normal.  Morbidly obese, non-tender with NABS  Musculoskeletal: Normal range of motion.  Neurological: He is alert and oriented to person, place, and time.  Skin: Skin is warm and dry.  Psychiatric: He has a normal mood and affect. His behavior is normal. Judgment and thought content normal.   Assessment/Plan: 1) Rectal bleeding with LLQ pain: will prep for a colonoscopy tomorrow.  2) Hyperglycemia on SSC.  3) Nephrolithiasis/hydronephrosis with stone obstructing the UP junction.  4) Seizure disorder-non compliant with meds.  5) Morbid obesity. 6) Isolated elevation in AST/thromboctopenia-check serologies ?Fatty liver .   MANN,JYOTHI 11/06/2013, 5:49 PM

## 2013-11-06 NOTE — Progress Notes (Signed)
Rodney MainsWilliam J Steele [id number] Admission Data: 11/06/2013 9:04 AM Attending Provider: Joseph ArtJessica U Vann, DO  PCP:Pcp Not In System Consults/ Treatment Team: Treatment Team:  Rodney LudwigSigmund I Tannenbaum, MD Rodney BelfastPatrick D Hung, MD  Rodney Steele is a 48 y.o. male patient admitted from ED awake, alert  & orientated  X 3,  Full Code, VSS - Blood pressure 128/84, pulse 86, temperature 97.8 F (36.6 C), temperature source Oral, resp. rate 18, height 5\' 11"  (1.803 m), weight 140.615 kg (310 lb), SpO2 97.00%., O2    2 Steele nasal cannular, no c/o shortness of breath, no c/o chest pain, no distress noted.   IV site WDL:  Left A/C SL, Right Wrist SL.  Allergies:   Allergies  Allergen Reactions  . Penicillins Rash     Past Medical History  Diagnosis Date  . Epilepsy   . Kidney stones     Pt orientation to unit, room and routine. Information packet given to patient/family.  Admission INP armband ID verified with patient/family, and in place. SR up x 2, fall risk assessment complete with Patient and family verbalizing understanding of risks associated with falls. Pt verbalizes an understanding of how to use the call bell and to call for help before getting out of bed.  Skin, clean-dry- intact without evidence of bruising, or skin tears.   No evidence of skin break down noted on exam.     Will cont to monitor and assist as needed.  Rodney Steele, Rodney L, RN 11/06/2013 9:04 AM

## 2013-11-06 NOTE — ED Notes (Addendum)
Pt presents with Left lower back pain that has radiated to his Left lower, abd vomiting, and bloody stool. 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 Pt's symptoms started at 2300 last pm, pt has a hx of kidney stones but states this pain feels different. Pt unable to remain seated in wheelchair, pt with sudden onset of diaphoresis.

## 2013-11-06 NOTE — ED Notes (Signed)
Pt reports two episodes of rectal bleeding tonight.  Sts this is the first time this has occurred to him.  Pt is colorblind. Pt's wife sts she saw the blood in pt's stool.  Pt with hx of kidney stones.  Sts pain is "different".  Reported left-sided pain; no dysuria or hematuria.  Pt's abdomen distended during assessment.

## 2013-11-06 NOTE — ED Notes (Signed)
Patient transported to CT scan . 

## 2013-11-07 ENCOUNTER — Encounter (HOSPITAL_COMMUNITY)
Admission: EM | Disposition: A | Discharge status: Home / Self Care | Payer: Self-pay | Source: Home / Self Care | Attending: Internal Medicine

## 2013-11-07 ENCOUNTER — Other Ambulatory Visit: Payer: Self-pay | Admitting: Urology

## 2013-11-07 DIAGNOSIS — Q638 Other specified congenital malformations of kidney: Secondary | ICD-10-CM

## 2013-11-07 DIAGNOSIS — K625 Hemorrhage of anus and rectum: Secondary | ICD-10-CM

## 2013-11-07 DIAGNOSIS — N200 Calculus of kidney: Secondary | ICD-10-CM

## 2013-11-07 DIAGNOSIS — D696 Thrombocytopenia, unspecified: Secondary | ICD-10-CM

## 2013-11-07 LAB — CBC
HCT: 43.2 % (ref 39.0–52.0)
Hemoglobin: 14.6 g/dL (ref 13.0–17.0)
MCH: 27.6 pg (ref 26.0–34.0)
MCHC: 33.8 g/dL (ref 30.0–36.0)
MCV: 81.7 fL (ref 78.0–100.0)
PLATELETS: 94 10*3/uL — AB (ref 150–400)
RBC: 5.29 MIL/uL (ref 4.22–5.81)
RDW: 12.9 % (ref 11.5–15.5)
WBC: 6.5 10*3/uL (ref 4.0–10.5)

## 2013-11-07 LAB — GLUCOSE, CAPILLARY
GLUCOSE-CAPILLARY: 169 mg/dL — AB (ref 70–99)
GLUCOSE-CAPILLARY: 129 mg/dL — AB (ref 70–99)
Glucose-Capillary: 153 mg/dL — ABNORMAL HIGH (ref 70–99)
Glucose-Capillary: 105 mg/dL — ABNORMAL HIGH (ref 70–99)
Glucose-Capillary: 188 mg/dL — ABNORMAL HIGH (ref 70–99)

## 2013-11-07 LAB — BASIC METABOLIC PANEL
BUN: 17 mg/dL (ref 6–23)
CALCIUM: 8.7 mg/dL (ref 8.4–10.5)
CO2: 23 mEq/L (ref 19–32)
CREATININE: 1.39 mg/dL — AB (ref 0.50–1.35)
Chloride: 101 mEq/L (ref 96–112)
GFR calc Af Amer: 69 mL/min — ABNORMAL LOW (ref >90)
GFR calc non Af Amer: 59 mL/min — ABNORMAL LOW (ref >90)
Glucose, Bld: 166 mg/dL — ABNORMAL HIGH (ref 70–99)
Potassium: 4 mEq/L (ref 3.7–5.3)
Sodium: 138 mEq/L (ref 137–147)

## 2013-11-07 SURGERY — COLONOSCOPY
Anesthesia: Moderate Sedation | Laterality: Left

## 2013-11-07 MED ORDER — TOPIRAMATE 100 MG PO TABS
100.0000 mg | ORAL_TABLET | Freq: Every day | ORAL | Status: DC
  Filled 2013-11-07: qty 1

## 2013-11-07 MED ORDER — TOPIRAMATE 100 MG PO TABS: 100.0000 mg | ORAL_TABLET | Freq: Every day | ORAL | Status: DC

## 2013-11-07 MED ORDER — OXYCODONE-ACETAMINOPHEN 5-325 MG PO TABS
1.0000 | ORAL_TABLET | Freq: Four times a day (QID) | ORAL | Status: DC | PRN
  Administered 2013-11-07: 2 via ORAL
  Filled 2013-11-07: qty 2

## 2013-11-07 MED ORDER — SODIUM CHLORIDE 0.9 % IV SOLN
INTRAVENOUS | Status: DC
  Administered 2013-11-07: 1000 mL via INTRAVENOUS
  Administered 2013-11-08: 02:00:00 via INTRAVENOUS

## 2013-11-07 MED ORDER — BUSPIRONE HCL 10 MG PO TABS
10.0000 mg | ORAL_TABLET | Freq: Every day | ORAL | Status: DC | PRN
  Filled 2013-11-07: qty 1

## 2013-11-07 NOTE — Progress Notes (Signed)
Subjective: No further bleeding.  Objective: Vital signs in last 24 hours: Temp:  [98.1 F (36.7 C)-98.7 F (37.1 C)] 98.1 F (36.7 C) (06/18 1343) Pulse Rate:  [76-85] 76 (06/18 1343) Resp:  [16] 16 (06/18 1343) BP: (105-129)/(65-85) 105/65 mmHg (06/18 1343) SpO2:  [93 %-96 %] 95 % (06/18 1343) Last BM Date: 11/07/13  Intake/Output from previous day: 06/17 0701 - 06/18 0700 In: 3026.7 [I.V.:2871.7; IV Piggyback:155] Out: -  Intake/Output this shift: Total I/O In: -  Out: 600 [Urine:600]  General appearance: alert and no distress GI: some LLQ discomfort  Lab Results:  Recent Labs  11/06/13 1445 11/06/13 2044 11/07/13 0512  WBC 7.6 8.0 6.5  HGB 14.2 14.7 14.6  HCT 42.0 43.0 43.2  PLT 102* 106* 94*   BMET  Recent Labs  11/06/13 0756 11/06/13 1626 11/07/13 0512  NA 127* 139 138  K 3.8 4.5 4.0  CL 88* 105 101  CO2 24 18* 23  GLUCOSE 141* 117* 166*  BUN 8 18 17   CREATININE 0.56 1.16 1.39*  CALCIUM 8.7 8.5 8.7   LFT  Recent Labs  11/06/13 0756  PROT 7.2  ALBUMIN 3.5  AST 126*  ALT 50  ALKPHOS 67  BILITOT 1.0   PT/INR  Recent Labs  11/06/13 0200  LABPROT 12.1  INR 0.91   Hepatitis Panel No results found for this basename: HEPBSAG, HCVAB, HEPAIGM, HEPBIGM,  in the last 72 hours C-Diff No results found for this basename: CDIFFTOX,  in the last 72 hours Fecal Lactopherrin No results found for this basename: FECLLACTOFRN,  in the last 72 hours  Studies/Results: Ct Abdomen Pelvis W Contrast  11/06/2013   CLINICAL DATA:  Left lower back pain, radiating to the left lower quadrant of the abdomen. Vomiting and bloody stools.  EXAM: CT ABDOMEN AND PELVIS WITH CONTRAST  TECHNIQUE: Multidetector CT imaging of the abdomen and pelvis was performed using the standard protocol following bolus administration of intravenous contrast.  CONTRAST:  100mL OMNIPAQUE IOHEXOL 300 MG/ML  SOLN  COMPARISON:  None.  FINDINGS: Minimal bibasilar atelectasis is noted.   The liver and spleen are unremarkable in appearance. The gallbladder is within normal limits. The pancreas and adrenal glands are unremarkable.  A horseshoe kidney is noted. There is moderate hydronephrosis involving the left side of the horseshoe kidney, with an obstructing large 10 x 7 mm stone at the ureteropelvic junction, adjacent to the left renal pelvis. Multiple smaller nonobstructing stones are noted at the lower pole of the left side of the horseshoe kidney, measuring up to 8 mm in size.  The more distal ureters are decompressed; the right side of the horseshoe kidney is unremarkable in appearance. Left-sided perinephric stranding and fluid are seen.  No free fluid is identified. The small bowel is unremarkable in appearance. The stomach is within normal limits. No acute vascular abnormalities are seen.  The appendix is normal in caliber, without evidence for appendicitis. The colon is relatively decompressed and unremarkable appearance.  The bladder is mildly distended and grossly unremarkable. The prostate remains normal in size. No inguinal lymphadenopathy is seen.  No acute osseous abnormalities are identified. Vacuum phenomenon is noted at L5-S1.  IMPRESSION: 1. Moderate hydronephrosis involving the left side of the patient's horseshoe kidney, with an obstructing large 10 x 7 mm stone at the ureteropelvic junction, adjacent to the left renal pelvis. Left-sided perinephric stranding and fluid seen. 2. Multiple smaller nonobstructing stones at the lower pole of the left side of the horseshoe  kidney, measuring up to 8 mm in size.   Electronically Signed   By: Roanna RaiderJeffery  Chang M.D.   On: 11/06/2013 05:26    Medications:  Scheduled: . ceFEPime (MAXIPIME) IV  1 g Intravenous Q12H  . insulin aspart  0-9 Units Subcutaneous TID WC  . [START ON 11/08/2013] topiramate  100 mg Oral Daily   Continuous: . sodium chloride    . sodium chloride 1,000 mL (11/07/13 1439)    Assessment/Plan: 1)  Hematochezia. 2) Left hydronephrosis with obstructing renal stones.   The patient was not able to tolerate the Golytely prep.  He tried to drink it, but he started to vomit. No further hematochezia since his admission.  Just prior his admission he states that he had a hard bowel movement that may have precipitated his hematochezia.  The CT of the abdomen is negative for any colonic masses, diverticula or inflammation.  Plan: 1) Ureteral stenting tomorrow. 2) No colonoscopy at this time. 3) If rebleeding and/or anemia reconsult GI. 4) Signing off.   LOS: 1 day   HUNG,PATRICK D 11/07/2013, 4:36 PM

## 2013-11-07 NOTE — Progress Notes (Signed)
Utilization review completed. Luddie Boghosian, RN, BSN. 

## 2013-11-07 NOTE — Progress Notes (Signed)
Patient ID: Rodney MainsWilliam J Sherpa, male   DOB: 06/02/1965, 48 y.o.   MRN: [id number]  Day of Surgery Subjective: Pt's GI bleed has stopped.  Could not tolerate prep last night.  Now colonoscopy has been canceled.  He still complains of LLQ pain likely from his obstructing stone.  Pain is a "8-10" and he is taking IV pain meds. No fever.  Objective: Vital signs in last 24 hours: Temp:  [98.1 F (36.7 C)-98.7 F (37.1 C)] 98.1 F (36.7 C) (06/18 1343) Pulse Rate:  [76-85] 76 (06/18 1343) Resp:  [16] 16 (06/18 1343) BP: (105-129)/(65-85) 105/65 mmHg (06/18 1343) SpO2:  [93 %-96 %] 95 % (06/18 1343)  Intake/Output from previous day: 06/17 0701 - 06/18 0700 In: 3026.7 [I.V.:2871.7; IV Piggyback:155] Out: -  Intake/Output this shift: Total I/O In: -  Out: 600 [Urine:600]  Physical Exam:  General: Alert and oriented Abd: Soft, ND, Mild LLQ pain  Lab Results:  Recent Labs  11/06/13 1445 11/06/13 2044 11/07/13 0512  HGB 14.2 14.7 14.6  HCT 42.0 43.0 43.2   BMET  Recent Labs  11/06/13 1626 11/07/13 0512  NA 139 138  K 4.5 4.0  CL 105 101  CO2 18* 23  GLUCOSE 117* 166*  BUN 18 17  CREATININE 1.16 1.39*  CALCIUM 8.5 8.7     Studies/Results: Ct Abdomen Pelvis W Contrast  11/06/2013   CLINICAL DATA:  Left lower back pain, radiating to the left lower quadrant of the abdomen. Vomiting and bloody stools.  EXAM: CT ABDOMEN AND PELVIS WITH CONTRAST  TECHNIQUE: Multidetector CT imaging of the abdomen and pelvis was performed using the standard protocol following bolus administration of intravenous contrast.  CONTRAST:  100mL OMNIPAQUE IOHEXOL 300 MG/ML  SOLN  COMPARISON:  None.  FINDINGS: Minimal bibasilar atelectasis is noted.  The liver and spleen are unremarkable in appearance. The gallbladder is within normal limits. The pancreas and adrenal glands are unremarkable.  A horseshoe kidney is noted. There is moderate hydronephrosis involving the left side of the horseshoe kidney, with  an obstructing large 10 x 7 mm stone at the ureteropelvic junction, adjacent to the left renal pelvis. Multiple smaller nonobstructing stones are noted at the lower pole of the left side of the horseshoe kidney, measuring up to 8 mm in size.  The more distal ureters are decompressed; the right side of the horseshoe kidney is unremarkable in appearance. Left-sided perinephric stranding and fluid are seen.  No free fluid is identified. The small bowel is unremarkable in appearance. The stomach is within normal limits. No acute vascular abnormalities are seen.  The appendix is normal in caliber, without evidence for appendicitis. The colon is relatively decompressed and unremarkable appearance.  The bladder is mildly distended and grossly unremarkable. The prostate remains normal in size. No inguinal lymphadenopathy is seen.  No acute osseous abnormalities are identified. Vacuum phenomenon is noted at L5-S1.  IMPRESSION: 1. Moderate hydronephrosis involving the left side of the patient's horseshoe kidney, with an obstructing large 10 x 7 mm stone at the ureteropelvic junction, adjacent to the left renal pelvis. Left-sided perinephric stranding and fluid seen. 2. Multiple smaller nonobstructing stones at the lower pole of the left side of the horseshoe kidney, measuring up to 8 mm in size.   Electronically Signed   By: Roanna RaiderJeffery  Chang M.D.   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 Vernie Ammonsttelin had talked with Dr. Lendell CapriceSullivan earlier today and is  pl[facility]. This would appear to be appropriate considering ongoing pain.  Pt will then need f/u with Dr. Patsi Searsannenbaum for definitive stone treatment in the future. It is ok for patient to eat tonight and take po pain meds.  He should then be NPO after midnight for procedure in the morning. I discussed the potential benefits and risks of the procedure, side effects of the proposed treatment, the likelihood of the  patient achieving the goals of the procedure, and any potential problems that might occur during the procedure or recuperation.    LOS: 1 day   BORDEN,LES 11/07/2013, 5:21 PM

## 2013-11-07 NOTE — Discharge Instructions (Signed)

## 2013-11-07 NOTE — Progress Notes (Addendum)
Addendum: Discussed with Dr. Mann and Vernie Ammonsttelin. [facility] [facility].  Continue cefepime for now.  No plans for colonoscopy unless rebleeds. Patient thinks bleeding related to straining and "tear".  Crista Curborinna Sullivan, MD [facility]

## 2013-11-07 NOTE — Progress Notes (Signed)
Pt unable to hold down bowel prep. Zofran given with no relief. Dr. Loreta AveMann notified, prep cancelled. Will continue to monitor.

## 2013-11-07 NOTE — Progress Notes (Addendum)
Chart reviewed. D/w RN.  Colonoscopy and prep cancelled, as unable to tolerate prep (intractable vomiting)   [facility] PROGRESS NOTE  Lita MainsWilliam J Holroyd ZOX:[id number] DOB: 03/08/1966 DOA: 11/06/2013 PCP: Pcp Not In System  Assessment/Plan:   Principal Problem:   Rectal bleed has stopped. Colonoscopy cancelled due to vomiting: could not complete prep. Vitals and hgb stable. 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 Have paged Dr. Patsi Searsannenbaum. D/w Dr. Vernie Ammonsttelin who will get back to me. On cefepime, but UA negative. Question whether still needs this. Defer to urology.   Hyperglycemia: new DM confirmed by hgb a1c  SSI for now. Will need diabetic teaching when more alert and feeling better Seizures: has been off Topamax for 4 months without seizures.  Has a new job, so will likely get back on Topamax. Last seizure 4 years ago.  No need for keppra.  Will resume topamax when diet resumed Thrombocytopenia: no old for comparison Renal insufficiency: baseline unknown. D/c toradol. Cont IVF  Appreciate consultants!  Code Status:  full Family Communication:   Disposition Plan:  home  Consultants:  GI  urology  Procedures:     Antibiotics:    HPI/Subjective: No further bleeding.  C/o severe LLQ pain. No nausea or vomiting this am.  Objective: Filed Vitals:   11/07/13 1343  BP: 105/65  Pulse: 76  Temp: 98.1 F (36.7 C)  Resp: 16    Intake/Output Summary (Last 24 hours) at 11/07/13 1416 Last data filed at 11/07/13 1344  Gross per 24 hour  Intake 1026.67 ml  Output    600 ml  Net 426.67 ml   Filed Weights   11/06/13 0744  Weight: 140.615 kg (310 lb)    Exam:   General:  Groggy after morphine.   Cardiovascular: RRR without MGR  Respiratory: CTA without WRR  Abdomen: soft, Left flank and abdominal tenderness  Ext: no CCE  Basic Metabolic Panel:  Recent Labs Lab  11/06/13 0200 11/06/13 0219 11/06/13 0756 11/06/13 1626 11/07/13 0512  NA 135* 137 127* 139 138  K 4.3 3.8 3.8 4.5 4.0  CL 96 103 88* 105 101  CO2 26  --  24 18* 23  GLUCOSE 312* 307* 141* 117* 166*  BUN 17 18 8 18 17   CREATININE 1.04 1.20 0.56 1.16 1.39*  CALCIUM 9.8  --  8.7 8.5 8.7   Liver Function Tests:  Recent Labs Lab 11/06/13 0200 11/06/13 0756  AST 34 126*  ALT 53 50  ALKPHOS 101 67  BILITOT 0.3 1.0  PROT 8.0 7.2  ALBUMIN 4.3 3.5   No results found for this basename: LIPASE, AMYLASE,  in the last 168 hours No results found for this basename: AMMONIA,  in the last 168 hours CBC:  Recent Labs Lab 11/06/13 0200 11/06/13 0219 11/06/13 0630 11/06/13 1445 11/06/13 2044 11/07/13 0512  WBC 8.1  --  7.1 7.6 8.0 6.5  NEUTROABS 4.8  --   --   --   --   --   HGB 16.2 17.7* 14.6 14.2 14.7 14.6  HCT 46.8 52.0 43.1 42.0 43.0 43.2  MCV 80.7  --  81.0 80.9 80.8 81.7  PLT 120*  --  97* 102* 106* 94*   Cardiac Enzymes: No results found for this basename: CKTOTAL, CKMB, CKMBINDEX, TROPONINI,  in the last 168 hours BNP (last 3 results) No results found for this basename: PROBNP,  in the last  8760 hours CBG:  Recent Labs Lab 11/06/13 2033 11/06/13 2354 11/07/13 0427 11/07/13 0745 11/07/13 1216  GLUCAP 118* 156* 153* 169* 129*    No results found for this or any previous visit (from the past 240 hour(s)).   Studies: Ct Abdomen Pelvis W Contrast  11/06/2013   CLINICAL DATA:  Left lower back pain, radiating to the left lower quadrant of the abdomen. Vomiting and bloody stools.  EXAM: CT ABDOMEN AND PELVIS WITH CONTRAST  TECHNIQUE: Multidetector CT imaging of the abdomen and pelvis was performed using the standard protocol following bolus administration of intravenous contrast.  CONTRAST:  100mL OMNIPAQUE IOHEXOL 300 MG/ML  SOLN  COMPARISON:  None.  FINDINGS: Minimal bibasilar atelectasis is noted.  The liver and spleen are unremarkable in appearance. The gallbladder  is within normal limits. The pancreas and adrenal glands are unremarkable.  A horseshoe kidney is noted. There is moderate hydronephrosis involving the left side of the horseshoe kidney, with an obstructing large 10 x 7 mm stone at the ureteropelvic junction, adjacent to the left renal pelvis. Multiple smaller nonobstructing stones are noted at the lower pole of the left side of the horseshoe kidney, measuring up to 8 mm in size.  The more distal ureters are decompressed; the right side of the horseshoe kidney is unremarkable in appearance. Left-sided perinephric stranding and fluid are seen.  No free fluid is identified. The small bowel is unremarkable in appearance. The stomach is within normal limits. No acute vascular abnormalities are seen.  The appendix is normal in caliber, without evidence for appendicitis. The colon is relatively decompressed and unremarkable appearance.  The bladder is mildly distended and grossly unremarkable. The prostate remains normal in size. No inguinal lymphadenopathy is seen.  No acute osseous abnormalities are identified. Vacuum phenomenon is noted at L5-S1.  IMPRESSION: 1. Moderate hydronephrosis involving the left side of the patient's horseshoe kidney, with an obstructing large 10 x 7 mm stone at the ureteropelvic junction, adjacent to the left renal pelvis. Left-sided perinephric stranding and fluid seen. 2. Multiple smaller nonobstructing stones at the lower pole of the left side of the horseshoe kidney, measuring up to 8 mm in size.   Electronically Signed   By: Roanna RaiderJeffery  Chang M.D.   On: 11/06/2013 05:26    Scheduled Meds: . ceFEPime (MAXIPIME) IV  1 g Intravenous Q12H  . insulin aspart  0-9 Units Subcutaneous TID WC  . levETIRAcetam  500 mg Intravenous BID  . pantoprazole (PROTONIX) IV  40 mg Intravenous Q12H   Continuous Infusions: . sodium chloride      Time spent: 35 minutes  SULLIVAN,CORINNA L  [facility] Pager [phone]. If 7PM-7AM, please  contact night-coverage at [email or web address], password [email or web address] 11/07/2013, 2:16 PM  LOS: 1 day

## 2013-11-08 ENCOUNTER — Encounter (HOSPITAL_COMMUNITY): Payer: Self-pay | Admitting: Anesthesiology

## 2013-11-08 ENCOUNTER — Inpatient Hospital Stay (HOSPITAL_COMMUNITY): Payer: Self-pay

## 2013-11-08 ENCOUNTER — Ambulatory Visit (HOSPITAL_COMMUNITY): Admission: RE | Admit: 2013-11-08 | Payer: MEDICAID | Source: Ambulatory Visit | Admitting: Urology

## 2013-11-08 ENCOUNTER — Encounter (HOSPITAL_COMMUNITY)
Admission: EM | Disposition: A | Discharge status: Home / Self Care | Payer: Self-pay | Source: Home / Self Care | Attending: Internal Medicine

## 2013-11-08 ENCOUNTER — Inpatient Hospital Stay (HOSPITAL_COMMUNITY): Payer: Self-pay | Admitting: Anesthesiology

## 2013-11-08 HISTORY — PX: CYSTOSCOPY WITH RETROGRADE PYELOGRAM, URETEROSCOPY AND STENT PLACEMENT: SHX5789

## 2013-11-08 LAB — GLUCOSE, CAPILLARY
GLUCOSE-CAPILLARY: 155 mg/dL — AB (ref 70–99)
Glucose-Capillary: 147 mg/dL — ABNORMAL HIGH (ref 70–99)

## 2013-11-08 LAB — SURGICAL PCR SCREEN
MRSA, PCR: NEGATIVE
Staphylococcus aureus: POSITIVE — AB

## 2013-11-08 SURGERY — CYSTOURETEROSCOPY, WITH RETROGRADE PYELOGRAM AND STENT INSERTION
Anesthesia: General | Laterality: Left

## 2013-11-08 MED ORDER — KETOROLAC TROMETHAMINE 30 MG/ML IJ SOLN: 15.0000 mg | Freq: Once | INTRAMUSCULAR | Status: DC | PRN

## 2013-11-08 MED ORDER — CHLORHEXIDINE GLUCONATE CLOTH 2 % EX PADS: 6.0000 | MEDICATED_PAD | Freq: Every day | CUTANEOUS | Status: DC

## 2013-11-08 MED ORDER — PROPOFOL 10 MG/ML IV BOLUS
INTRAVENOUS | Status: DC | PRN
  Administered 2013-11-08: 200 mg via INTRAVENOUS
  Administered 2013-11-08: 100 mg via INTRAVENOUS

## 2013-11-08 MED ORDER — ONDANSETRON HCL 4 MG/2ML IJ SOLN
INTRAMUSCULAR | Status: DC | PRN
  Administered 2013-11-08: 4 mg via INTRAVENOUS

## 2013-11-08 MED ORDER — MUPIROCIN 2 % EX OINT
1.0000 "application " | TOPICAL_OINTMENT | Freq: Two times a day (BID) | CUTANEOUS | Status: DC
  Administered 2013-11-08: 1 via NASAL
  Filled 2013-11-08: qty 22

## 2013-11-08 MED ORDER — OXYCODONE-ACETAMINOPHEN 10-325 MG PO TABS: 1.0000 | ORAL_TABLET | ORAL | Status: DC | PRN

## 2013-11-08 MED ORDER — LACTATED RINGERS IV SOLN: INTRAVENOUS | Status: DC

## 2013-11-08 MED ORDER — DEXTROSE 5 % IV SOLN
1.0000 g | Freq: Once | INTRAVENOUS | Status: AC
  Administered 2013-11-08: 1 g via INTRAVENOUS
  Filled 2013-11-08: qty 1

## 2013-11-08 MED ORDER — OXYCODONE HCL 5 MG PO TABS
5.0000 mg | ORAL_TABLET | ORAL | Status: DC | PRN
  Administered 2013-11-08: 5 mg via ORAL
  Filled 2013-11-08: qty 1

## 2013-11-08 MED ORDER — LACTATED RINGERS IV SOLN
INTRAVENOUS | Status: DC | PRN
  Administered 2013-11-08: 07:00:00 via INTRAVENOUS

## 2013-11-08 MED ORDER — PHENAZOPYRIDINE HCL 200 MG PO TABS: 200.0000 mg | ORAL_TABLET | Freq: Three times a day (TID) | ORAL | Status: DC | PRN

## 2013-11-08 MED ORDER — STERILE WATER FOR IRRIGATION IR SOLN
Status: DC | PRN
  Administered 2013-11-08: 3000 mL

## 2013-11-08 MED ORDER — FENTANYL CITRATE 0.05 MG/ML IJ SOLN
INTRAMUSCULAR | Status: DC | PRN
  Administered 2013-11-08 (×2): 25 ug via INTRAVENOUS

## 2013-11-08 MED ORDER — PHENAZOPYRIDINE HCL 200 MG PO TABS
200.0000 mg | ORAL_TABLET | Freq: Once | ORAL | Status: AC
  Administered 2013-11-08: 200 mg via ORAL
  Filled 2013-11-08: qty 1

## 2013-11-08 MED ORDER — PROMETHAZINE HCL 25 MG/ML IJ SOLN: 6.2500 mg | INTRAMUSCULAR | Status: DC | PRN

## 2013-11-08 MED ORDER — FENTANYL CITRATE 0.05 MG/ML IJ SOLN: 25.0000 ug | INTRAMUSCULAR | Status: DC | PRN

## 2013-11-08 MED ORDER — MIDAZOLAM HCL 5 MG/5ML IJ SOLN
INTRAMUSCULAR | Status: DC | PRN
  Administered 2013-11-08: 2 mg via INTRAVENOUS

## 2013-11-08 MED ORDER — OXYCODONE-ACETAMINOPHEN 5-325 MG PO TABS
1.0000 | ORAL_TABLET | ORAL | Status: DC | PRN
  Administered 2013-11-08: 1 via ORAL
  Filled 2013-11-08: qty 1

## 2013-11-08 SURGICAL SUPPLY — 14 items
BAG URO CATCHER STRL LF (DRAPE) ×2 IMPLANT
BASKET ZERO TIP NITINOL 2.4FR (BASKET) IMPLANT
CATH INTERMIT  6FR 70CM (CATHETERS) ×2 IMPLANT
CLOTH BEACON ORANGE TIMEOUT ST (SAFETY) ×2 IMPLANT
DRAPE CAMERA CLOSED 9X96 (DRAPES) ×2 IMPLANT
GLOVE BIOGEL M 8.0 STRL (GLOVE) ×2 IMPLANT
GOWN STRL REUS W/ TWL XL LVL3 (GOWN DISPOSABLE) IMPLANT
GOWN STRL REUS W/TWL XL LVL3 (GOWN DISPOSABLE) ×2 IMPLANT
GUIDEWIRE ANG ZIPWIRE 038X150 (WIRE) IMPLANT
GUIDEWIRE STR DUAL SENSOR (WIRE) ×2 IMPLANT
MANIFOLD NEPTUNE II (INSTRUMENTS) ×2 IMPLANT
PACK CYSTO (CUSTOM PROCEDURE TRAY) ×2 IMPLANT
STENT CONTOUR 6FRX24X.038 (STENTS) ×2 IMPLANT
TUBING CONNECTING 10 (TUBING) ×2 IMPLANT

## 2013-11-08 NOTE — Op Note (Signed)
PATIENT:  Rodney MainsWilliam J Steele  PRE-OPERATIVE DIAGNOSIS: Left UPJ stone  POST-OPERATIVE DIAGNOSIS: Same  PROCEDURE: 1. Cystoscopy with left retrograde pyelogram including interpretation 2. Left double-J stent placement  SURGEON:  Garnett FarmMark C Ottelin  INDICATION: Rodney MainsWilliam J Steele is a 48 year old male patient of Dr. Patsi Searsannenbaum who was admitted to the hospital with a lower GI bleed and was having significant pain throughout his hospitalization do to a stone located at the ureteropelvic junction seen on CT scan. After it was determined his lower GI bleed was likely from a benign cause, and he was still experiencing significant intermittent abdominal/flank pain, it was decided to proceed with double-J stent placement. This was discussed with the patient in detail. He has elected to proceed.  ANESTHESIA:  General  EBL:  Minimal  DRAINS: 6 French, 24 cm double-J stent in the left ureter (no string)  LOCAL MEDICATIONS USED:  2% lidocaine jelly per urethra  SPECIMEN:  None  Description of procedure: After informed consent the patient was taken to the operating room and placed on the table in a supine position. General anesthesia was then administered. Once fully anesthetized the patient was moved to the dorsal lithotomy position and the genitalia were sterilely prepped and draped in standard fashion. An official timeout was then performed.  The 22 French cystoscope was then passed under direct vision down the urethra which was noted be normal. The prostatic urethra revealed no lesions although he had a fairly high bladder neck. Upon entering the bladder I noted both ureteral orifices were of normal configuration and position. He had no tumors, stones or inflammatory lesions identified within the bladder upon full and systematic inspection.  A left retrograde pyelogram was then performed by passing a 6 [place] open-ended ureteral catheter through the cystoscope and into the left ureteral orifice. Full-strength  iodinated contrast was then injected through the open-ended catheter and up the left ureter which was noted to be completely normal in its distal aspect but there was an abrupt area of increased dilatation consistent with the previously seen density in that location consistent with the stone. There was proximal dilatation of the renal pelvis and collecting system. It was somewhat distorted due to the fact that he has a horseshoe kidney.  A 0.038 inch floppy-tipped guidewire was then passed through the open ended catheter and into the or the renal pelvis under direct fluoroscopy. The open-ended catheter was removed and the stent was passed over the guidewire into the area the renal pelvis and as the guidewire was removed good curl was noted in the renal pelvis and visually within the bladder. The bladder was then drained and 2% lidocaine jelly was instilled in the urethra and a penile clamp was applied. The patient was then awakened and taken to recovery room in stable and satisfactory condition. He tolerated procedure well with no intraoperative complications.  He'll be discharged from the hospital today with followup as an outpatient with Dr. Patsi Searsannenbaum to discuss definitive therapy of his stone likely with lithotripsy.  PLAN OF CARE: Discharge to home after PACU  PATIENT DISPOSITION:  PACU - hemodynamically stable.

## 2013-11-08 NOTE — Progress Notes (Signed)
Penile clamp in place.

## 2013-11-08 NOTE — Transfer of Care (Signed)
Immediate Anesthesia Transfer of Care Note  Patient: Rodney MainsWilliam J Steele  Procedure(s) Performed: Procedure(s) (LRB): CYSTOSCOPY WITH LEFT RETROGRADE PYELOGRAM, AND STENT PLACEMENT (Left)  Patient Location: PACU  Anesthesia Type: General  Level of Consciousness: sedated, patient cooperative and responds to stimulation  Airway & Oxygen Therapy: Patient Spontanous Breathing and Patient connected to face mask oxgen  Post-op Assessment: Report given to PACU RN and Post -op Vital signs reviewed and stable  Post vital signs: Reviewed and stable  Complications: No apparent anesthesia complications

## 2013-11-08 NOTE — Progress Notes (Signed)
  RD consulted for nutrition education regarding diabetes.   Lab Results  Component Value Date   HGBA1C 9.3* 11/06/2013    RD provided "Carbohydrate Counting for People with Diabetes" handout from the Academy of Nutrition and Dietetics. Discussed different food groups and their effects on blood sugar, emphasizing carbohydrate-containing foods. Provided list of carbohydrates and recommended serving sizes of common foods.  Discussed importance of controlled and consistent carbohydrate intake throughout the day. Provided examples of ways to balance meals/snacks and encouraged intake of high-fiber, whole grain complex carbohydrates. Teach back method used.  Expect good compliance.  Body mass index is 43.26 kg/(m^2). Pt meets criteria for extreme obesity based on current BMI.  Contact information for the [facility] provided.    Oran ReinLaura Jobe, RD, LDN Clinical Inpatient Dietitian Pager:  [phone] Weekend and after hours pager:  [phone]

## 2013-11-08 NOTE — Anesthesia Preprocedure Evaluation (Signed)
Anesthesia Evaluation  Patient identified by MRN, date of birth, ID band Patient awake    Reviewed: Allergy & Precautions, H&P , NPO status , Patient's Chart, lab work & pertinent test results  Airway Mallampati: II TM Distance: >3 FB Neck ROM: Full    Dental no notable dental hx.    Pulmonary neg pulmonary ROS, former smoker,  breath sounds clear to auscultation  Pulmonary exam normal       Cardiovascular negative cardio ROS  Rhythm:Regular Rate:Normal     Neuro/Psych Seizures -,  negative psych ROS   GI/Hepatic negative GI ROS, Neg liver ROS,   Endo/Other  diabetesMorbid obesity  Renal/GU negative Renal ROS  negative genitourinary   Musculoskeletal negative musculoskeletal ROS (+)   Abdominal   Peds negative pediatric ROS (+)  Hematology thrombocytopenia   Anesthesia Other Findings   Reproductive/Obstetrics negative OB ROS                           Anesthesia Physical Anesthesia Plan  ASA: III  Anesthesia Plan: General   Post-op Pain Management:    Induction: Intravenous  Airway Management Planned: LMA  Additional Equipment:   Intra-op Plan:   Post-operative Plan:   Informed Consent: I have reviewed the patients History and Physical, chart, labs and discussed the procedure including the risks, benefits and alternatives for the proposed anesthesia with the patient or authorized representative who has indicated his/her understanding and acceptance.   Dental advisory given  Plan Discussed with: CRNA and Surgeon  Anesthesia Plan Comments:         Anesthesia Quick Evaluation

## 2013-11-08 NOTE — Anesthesia Postprocedure Evaluation (Signed)
  Anesthesia Post-op Note  Patient: Rodney MainsWilliam J Friend  Procedure(s) Performed: Procedure(s) (LRB): CYSTOSCOPY WITH LEFT RETROGRADE PYELOGRAM, AND STENT PLACEMENT (Left)  Patient Location: PACU  Anesthesia Type: General  Level of Consciousness: awake and alert   Airway and Oxygen Therapy: Patient Spontanous Breathing  Post-op Pain: mild  Post-op Assessment: Post-op Vital signs reviewed, Patient's Cardiovascular Status Stable, Respiratory Function Stable, Patent Airway and No signs of Nausea or vomiting  Last Vitals:  Filed Vitals:   11/08/13 0931  BP: 132/87  Pulse: 77  Temp: 36.6 C  Resp: 16    Post-op Vital Signs: stable   Complications: No apparent anesthesia complications

## 2013-11-08 NOTE — Progress Notes (Signed)
Patient states he is a newly diagnosed diabetic. Dietician Vernona RiegerLaura in to talk to patient before he is discharged

## 2013-11-08 NOTE — Progress Notes (Signed)
Penile clamp removed without difficulty-

## 2013-11-11 ENCOUNTER — Encounter (HOSPITAL_COMMUNITY): Payer: Self-pay | Admitting: Urology

## 2013-11-14 NOTE — Discharge Summary (Signed)
Physician Discharge Summary  Rodney Steele NFA:[id number] DOB: 07-01-1965 DOA: 11/06/2013  PCP: Rodney Rua, MD  Admit date: 11/06/2013 Discharge date: 11/08/13  Time spent: ess than 30 minutes  Recommendations for Outpatient Follow-up:  1. If rectal bleeding continues, follow up with Rodney Steele to reschedule colonoscopy 2. 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 Active Problems:   Hydronephrosis   New diabetes   Seizures   Limited rectal bleed   Thrombocytopenia, unspecified  Discharge Condition: stable  Filed Weights   11/06/13 0744  Weight: 140.615 kg (310 lb)    History of present illness:  48 y.o. male with history of seizures and nephrolithiasis started experiencing pain in the left lower quadrant and flank since last night 11 PM. The pain was constant with some nausea vomiting. Patient experienced at least 3 episodes of frank rectal bleeding spontaneously. Had some chills and rigors. Denies any chest pain or shortness of breath. In the ER stool for occult blood was positive. 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 On-call urologist Rodney Steele was consulted by the ED physician. Rodney Steele will be seeing patient in consult. Presently patient is hemodynamically stable. Patient has not had any further episodes of rectal bleeding. In addition patient was found to have hyperglycemia which is new. Patient has history of seizures and has not been taking his medications for last 5 months because of financial issues and ED physician has loaded patient with Keppra.   Hospital Course:    left ureteral stone with hydronephrosis of horseshoe kidney. Urology consulted. Recommended transfer to [facility] for stenting.  Discharged by urologist before being seen by hospitalist day of discharge.     New diabetes: given diabetic teaching.  Will need f/u  with PCP.    Seizures: recommended resuming topamax. Has had no seizures while off AEDs.    Limited rectal bleed: GI consulted.  Initially planned for colonoscopy, but unable to tolerate prep due to vomiting.  Bleeding stopped spontaneously, without drop in hgb or blood pressure.  Pt reports it happened while straining.  May be hemmorhoidal or anal fissure.  May f/u with GI if returns.    Thrombocytopenia, unspecified   Procedures: 1. Cystoscopy with left retrograde pyelogram including interpretation  2. Left double-J stent placement By Rodney Steele  Consultations:  GI: Rodney Steele  urology  Discharge Instructions   Discharge patient    Complete by:  As directed      Nursing communication    Complete by:  As directed   Patient may have 1-2 doses of the pain medication prescribed as per prescription.     Nursing communication    Complete by:  As directed   Remove penile clamp prior to discharge home.  Remove penile clamp prior to discharge home.            Medication List         busPIRone 10 MG tablet  Commonly known as:  BUSPAR  Take 10 mg by mouth daily as needed (jittery).     oxyCODONE-acetaminophen 10-325 MG per tablet  Commonly known as:  PERCOCET  Take 1-2 tablets by mouth every 4 (four) hours as needed for pain. Maximum dose per 24 hours - 8 pills     phenazopyridine 200 MG tablet  Commonly known as:  PYRIDIUM  Take 1 tablet (200 mg total) by mouth 3 (three) times daily as needed for pain.  topiramate 100 MG tablet  Commonly known as:  TOPAMAX  Take 100 mg by mouth daily as needed (epilepsy).       Allergies  Allergen Reactions  . Penicillins Rash       Follow-up Information   Follow up with Jethro BolusANNENBAUM, Rodney I, MD. (To schedule the procedure to break up your kidney stone. )    Specialty:  Urology   Contact information:   [address] [place] [address] [phone]        The results of significant diagnostics from this hospitalization  (including imaging, microbiology, ancillary and laboratory) are listed below for reference.    Significant Diagnostic Studies: Dg Chest 2 View  11/08/2013   CLINICAL DATA:  Preop radiograph.  Cystoscopy with stent placement.  EXAM: CHEST  2 VIEW  COMPARISON:  None.  FINDINGS: Cardiopericardial silhouette within normal limits. Mediastinal contours normal. Trachea midline. No airspace disease or effusion.  IMPRESSION: No active cardiopulmonary disease.   Electronically Signed   By: Rodney NewportGeoffrey  Lamke M.D.   On: 11/08/2013 02:24   Ct Abdomen Pelvis W Contrast  11/06/2013   CLINICAL DATA:  Left lower back pain, radiating to the left lower quadrant of the abdomen. Vomiting and bloody stools.  EXAM: CT ABDOMEN AND PELVIS WITH CONTRAST  TECHNIQUE: Multidetector CT imaging of the abdomen and pelvis was performed using the standard protocol following Steele administration of intravenous contrast.  CONTRAST:  100mL OMNIPAQUE IOHEXOL 300 MG/ML  SOLN  COMPARISON:  None.  FINDINGS: Minimal bibasilar atelectasis is noted.  The liver and spleen are unremarkable in appearance. The gallbladder is within normal limits. The pancreas and adrenal glands are unremarkable.  A horseshoe kidney is noted. There is moderate hydronephrosis involving the left side of the horseshoe kidney, with an obstructing large 10 x 7 mm stone at the ureteropelvic junction, adjacent to the left renal pelvis. Multiple smaller nonobstructing stones are noted at the lower pole of the left side of the horseshoe kidney, measuring up to 8 mm in size.  The more distal ureters are decompressed; the right side of the horseshoe kidney is unremarkable in appearance. Left-sided perinephric stranding and fluid are seen.  No free fluid is identified. The small bowel is unremarkable in appearance. The stomach is within normal limits. No acute vascular abnormalities are seen.  The appendix is normal in caliber, without evidence for appendicitis. The colon is relatively  decompressed and unremarkable appearance.  The bladder is mildly distended and grossly unremarkable. The prostate remains normal in size. No inguinal lymphadenopathy is seen.  No acute osseous abnormalities are identified. Vacuum phenomenon is noted at L5-S1.  IMPRESSION: 1. Moderate hydronephrosis involving the left side of the patient's horseshoe kidney, with an obstructing large 10 x 7 mm stone at the ureteropelvic junction, adjacent to the left renal pelvis. Left-sided perinephric stranding and fluid seen. 2. Multiple smaller nonobstructing stones at the lower pole of the left side of the horseshoe kidney, measuring up to 8 mm in size.   Electronically Signed   By: Roanna RaiderJeffery  Chang M.D.   On: 11/06/2013 05:26    Microbiology: Recent Results (from the past 240 hour(s))  SURGICAL PCR SCREEN     Status: Abnormal   Collection Time    11/08/13 12:23 AM      Result Value Ref Range Status   MRSA, PCR NEGATIVE  NEGATIVE Final   Staphylococcus aureus POSITIVE (*) NEGATIVE Final   Comment:  The Xpert SA Assay (FDA     approved for NASAL specimens     in patients over 46 years of age),     is one component of     a comprehensive surveillance     program.  Test performance has     been validated by [facility] for patients greater     than or equal to 46 year old.     It is not intended     to diagnose infection nor to     guide or monitor treatment.     Labs: Basic Metabolic Panel: No results found for this basename: NA, K, CL, CO2, GLUCOSE, BUN, CREATININE, CALCIUM, MG, PHOS,  in the last 168 hours Liver Function Tests: No results found for this basename: AST, ALT, ALKPHOS, BILITOT, PROT, ALBUMIN,  in the last 168 hours No results found for this basename: LIPASE, AMYLASE,  in the last 168 hours No results found for this basename: AMMONIA,  in the last 168 hours CBC: No results found for this basename: WBC, NEUTROABS, HGB, HCT, MCV, PLT,  in the last 168 hours Cardiac  Enzymes: No results found for this basename: CKTOTAL, CKMB, CKMBINDEX, TROPONINI,  in the last 168 hours BNP: BNP (last 3 results) No results found for this basename: PROBNP,  in the last 8760 hours CBG:  Recent Labs Lab 11/07/13 1706 11/07/13 2115 11/08/13 0831 11/08/13 1000  GLUCAP 105* 188* 155* 147*       Signed:  SULLIVAN,CORINNA L  [facility] 11/14/2013, 1:55 PM

## 2014-02-12 ENCOUNTER — Other Ambulatory Visit: Payer: Self-pay | Admitting: Urology

## 2014-03-25 ENCOUNTER — Telehealth: Payer: Self-pay | Admitting: Diagnostic Neuroimaging

## 2014-03-25 ENCOUNTER — Encounter: Payer: Self-pay | Admitting: Diagnostic Neuroimaging

## 2014-03-25 ENCOUNTER — Ambulatory Visit (INDEPENDENT_AMBULATORY_CARE_PROVIDER_SITE_OTHER): Payer: BC Managed Care – PPO | Admitting: Diagnostic Neuroimaging

## 2014-03-25 VITALS — BP 122/73 | HR 71 | Ht 72.0 in | Wt 233.4 lb

## 2014-03-25 DIAGNOSIS — G40209 Localization-related (focal) (partial) symptomatic epilepsy and epileptic syndromes with complex partial seizures, not intractable, without status epilepticus: Secondary | ICD-10-CM

## 2014-03-25 DIAGNOSIS — N200 Calculus of kidney: Secondary | ICD-10-CM | POA: Insufficient documentation

## 2014-03-25 HISTORY — DX: Calculus of kidney: N20.0

## 2014-03-25 MED ORDER — TOPIRAMATE 100 MG PO TABS: 100.0000 mg | ORAL_TABLET | Freq: Two times a day (BID) | ORAL | Status: DC

## 2014-03-25 MED ORDER — LEVETIRACETAM 500 MG PO TABS: 500.0000 mg | ORAL_TABLET | Freq: Two times a day (BID) | ORAL | Status: DC

## 2014-03-25 NOTE — Patient Instructions (Signed)
Start levetiracetam 500mg twice a day. 
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
## 2014-03-25 NOTE — Telephone Encounter (Signed)
Patient requesting new Rx for topiramate (TOPAMAX) 100 MG tablet and levETIRAcetam (KEPPRA) 500 MG tablet faxed to [facility] on [address]  Please call and advise.

## 2014-03-25 NOTE — Telephone Encounter (Signed)
Patient has requested we resend Rx's to [facility] instead of [facility].  Rx's have been resent.  I called the patient back, got no answer.  Left message.

## 2014-05-02 ENCOUNTER — Ambulatory Visit: Payer: BC Managed Care – PPO | Admitting: Diagnostic Neuroimaging

## 2014-05-05 ENCOUNTER — Ambulatory Visit (INDEPENDENT_AMBULATORY_CARE_PROVIDER_SITE_OTHER): Payer: BC Managed Care – PPO | Admitting: Diagnostic Neuroimaging

## 2014-05-05 ENCOUNTER — Encounter: Payer: Self-pay | Admitting: Diagnostic Neuroimaging

## 2014-05-05 VITALS — BP 103/64 | HR 60 | Temp 97.4°F | Ht 70.5 in | Wt 220.0 lb

## 2014-05-05 DIAGNOSIS — G40209 Localization-related (focal) (partial) symptomatic epilepsy and epileptic syndromes with complex partial seizures, not intractable, without status epilepticus: Secondary | ICD-10-CM

## 2014-05-05 MED ORDER — LEVETIRACETAM 500 MG PO TABS: 500.0000 mg | ORAL_TABLET | Freq: Two times a day (BID) | ORAL | Status: DC
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
## 2014-05-05 NOTE — Patient Instructions (Signed)
Continue levetiracetam. 

## 2014-05-07 ENCOUNTER — Encounter (HOSPITAL_COMMUNITY): Payer: Self-pay | Admitting: General Practice

## 2014-05-11 NOTE — H&P (Signed)
Reason For Visit Dr. Patsi Searsannenbaum  patient with multiple left renal calculi for ESWL   Active Problems Problems  1. Horseshoe kidney (753.3)   Left 2. Nephrolithiasis (592.0) 3. Obstruction of left ureteropelvic junction (UPJ) due to stone (592.1)  History of Present Illness 48 yo male returns today for f/u after CT scan. He was seen in consultation on 11/06/13 for a Lt horseshoe kidney with moderate hydronephrosis with an obstructing 10 x 7mm Lt UPJ stone. He had multiple smaller Lt lower pole stones with largest measuring up to 8mm. He is s/p cysto/Lt RPG/Lt JJ stent placement by Dr. Vernie Ammonsttelin on 11/08/13.    At time of admission he had a GI bleed. He has lost 55 lbs since June. ( 1500 cal diet).   Past Medical History Problems  1. History of Anxiety (300.00) 2. History of diabetes mellitus (V12.29) 3. History of esophageal reflux (V12.79) 4. History of gastrointestinal hemorrhage (V12.79) 5. History of hypercholesterolemia (V12.29) 6. History of Seizure  Surgical History Problems  1. History of Arthroscopy Knee Right 2. History of Cystoscopy With Insertion Of Ureteral Stent Left 3. History of Hand Incision Tendon Sheath Of A Finger 4. History of Hand Repair  Current Meds 1. BusPIRone HCl - 10 MG Oral Tablet;  Therapy: (Recorded:29Jul2015) to Recorded 2. Multiple Vitamin TABS;  Therapy: (Recorded:29Jul2015) to Recorded 3. Topiramate 100 MG Oral Tablet;  Therapy: (Recorded:29Jul2015) to Recorded  Allergies Medication  1. Penicillins  Family History Problems  1. Family history of Deceased : Mother, Father 2. Family history of depression (V17.0) : Mother 3. Family history of diabetes mellitus (V18.0) : Mother 4. Family history of malignant neoplasm (V16.9) : Mother  Social History Problems  1. Former smoker (V15.82) 2. Former smoker (V15.82)   1 ppd x 8579yrs 3. No alcohol use 4. No caffeine use 5. Occupation   Custodial  Review of Systems Genitourinary,  constitutional, skin, eye, otolaryngeal, hematologic/lymphatic, cardiovascular, pulmonary, endocrine, musculoskeletal, gastrointestinal, neurological and psychiatric system(s) were reviewed and pertinent findings if present are noted.  Genitourinary: urinary frequency, nocturia, hematuria, penile pain and initiating urination requires straining. C/O stent doscomfort Gastrointestinal: nausea, vomiting, heartburn and diarrhea.  Constitutional: recent ~100lb weight loss.  ENT: sinus problems.  Endocrine: polydipsia.  Psychiatric: anxiety and depression.    Vitals Vital Signs [Data Includes: Last 1 Day]  Recorded: 17Sep2015 08:31AM  Weight: 253 lb  BMI Calculated: 35.29 BSA Calculated: 2.33 Blood Pressure: 112 / 68 Temperature: 97.9 F Heart Rate: 61  Physical Exam Constitutional: Well nourished and well developed . No acute distress.  ENT:. The ears and nose are normal in appearance.  Neck: The appearance of the neck is normal and no neck mass is present.  Pulmonary: No respiratory distress and normal respiratory rhythm and effort.  Cardiovascular: Heart rate and rhythm are normal . No peripheral edema.  Abdomen: The abdomen is mildly obese. The abdomen is soft and nontender. No masses are palpated. No CVA tenderness. No hernias are palpable. No hepatosplenomegaly noted.  Skin: Normal skin turgor, no visible rash and no visible skin lesions.  Neuro/Psych:. Mood and affect are appropriate.    Results/Data  Urine [Data Includes: Last 1 Day]   17Sep2015 COLOR YELLOW  APPEARANCE CLEAR  SPECIFIC GRAVITY 1.015  pH 6.0  GLUCOSE NEG mg/dL BILIRUBIN NEG  KETONE 40 mg/dL BLOOD LARGE  PROTEIN 30 mg/dL UROBILINOGEN 0.2 mg/dL NITRITE NEG  LEUKOCYTE ESTERASE TRACE  SQUAMOUS EPITHELIAL/HPF RARE  WBC 0-2 WBC/hpf RBC 21-50 RBC/hpf BACTERIA RARE  CRYSTALS NONE SEEN  CASTS NONE SEEN  Selected Results  UA With REFLEX 17Sep2015 08:04AM Steele, Rodney SPECIMEN TYPE: CLEAN CATCH  Test  Name Result Flag Reference COLOR YELLOW  YELLOW APPEARANCE CLEAR  CLEAR SPECIFIC GRAVITY 1.015  1.005-1.030 pH 6.0  5.0-8.0 GLUCOSE NEG mg/dL  NEG BILIRUBIN NEG  NEG KETONE 40 mg/dL A NEG BLOOD LARGE A NEG PROTEIN 30 mg/dL A NEG UROBILINOGEN 0.2 mg/dL  1.6-1.00.0-1.0 NITRITE NEG  NEG LEUKOCYTE ESTERASE TRACE A NEG SQUAMOUS EPITHELIAL/HPF RARE  RARE WBC 0-2 WBC/hpf  <3 RBC 21-50 RBC/hpf A <3 BACTERIA RARE  RARE CRYSTALS NONE SEEN  NONE SEEN CASTS NONE SEEN  NONE SEEN  AU CT-STONE PROTOCOL 14Sep2015 12:00AM Rodney Steele, Rodney  Test Name Result Flag Reference CT-STONE PROTOCOL (Report)   ** RADIOLOGY REPORT BY [facility] **   CLINICAL DATA: Left lower quadrant pain. Gross hematuria. Status post stent placement for obstructing left UPJ calculus. A horseshoe kidney.  EXAM: CT ABDOMEN AND PELVIS WITHOUT CONTRAST (URINARY CALCULUS PROTOCOL)  TECHNIQUE: Multidetector CT imaging was performed through the abdomen and pelvis without intravenous contrast to include the urinary tract.  COMPARISON: 11/06/2013  FINDINGS: Liver: No mass or other abnormality visualized on this non-contrast exam.  Gallbladder/Biliary: Unremarkable.  Pancreas: No mass or inflammatory process visualized on this non-contrast exam.  Spleen: Within normal limits in size.  Adrenal Glands: No mass identified.  Kidneys/Urinary tract: Horseshoe kidney again demonstrated with several small less than 1 cm left-sided intrarenal calculi. A left ureteral stent is now seen in appropriate position with resolution of left-sided hydronephrosis since prior exam. No evidence of perinephric hematoma or fluid.  Lymph Nodes: No pathologically enlarged lymph nodes identified.  Pelvic/Reproductive Organs: No mass or other significant abnormality noted.  Bowel/Peritoneum: Unremarkable.  Vascular: No evidence of abdominal aortic aneurysm.  Musculoskeletal: No suspicious bone lesions  identified.  Other: None.  IMPRESSION: Left ureteral stent in appropriate position with near complete resolution of left-sided hydronephrosis since prior exam.  Horseshoe kidney with multiple small less than 1 cm left-sided intrarenal calculi again noted.   Electronically Signed  By: Rodney RosenthalJohn Steele M.D.  On: 02/03/2014 09:53   03 Feb 2014 12:00 AM  AU CT-STONE PROTOCOL    CT-STONE PROTOCOL ** [facility] RE[facility] **    CLINICAL DATA: Left lower quadrant pain. Gross hematuria. Status  post stent placement for obstructing left UPJ calculus. A horseshoe  kidney.   EXAM:  CT ABDOMEN AND PELVIS WITHOUT CONTRAST Lennox Laity(URINAR  Assessment Assessed  1. Horseshoe kidney (753.3) 2. Nephrolithiasis (592.0) 3. Obstruction of left ureteropelvic junction (UPJ) due to stone (592.1) 4. Obesity (278.00) 5. Ectopic insertion of ureter (753.4)  ( 45 minute consult) 48 yo male with remote hx of epilepsy ( no seizure in many years), and recent 55 lb wt loss, with L JJ stent placed in June. he now has resolution of L hydronephrosis, and wants to have lithotripsy of his L renal stones. He is mildly uncomfortable from his stent with urinary frequency and irritability of voiding, and he has microscopic-but no gross-hematuria, and occasional L orchalgia. He has no back or flank pain, or fever.    We have reviewed his CT, and note his BMI is > 30 ( calculated to be 35.29-down from 39.5); which may allow for dampening of the lithotripsy shock wave and prevent good dissolution of hei stones. he has stone measurements < 10mm ( 9.1 x 5.7 and 7.7 x 3 mm). heh has HU measurements of 388 and 681 ( within  the 900 HU limit). we have reviewed his anatomy, and still believe lithotripsy could be a reasonable ap[proach for treatment of his stones. He has a high insertion of his L ureter/UPJ, and I think it may present problems trying to lase the renal stones. Additional concern id that JJ stent will have  been in for 6 months in December, and may need to be changed if he will need multiple sessions in the lithotripter.   Plan Horseshoe kidney, Nephrolithiasis, Obesity, Obstruction of left ureteropelvic junction (UPJ) due to stone  1. Start: Potassium Citrate ER 10 MEQ (1080 MG) Oral Tablet Extended  Release; TAKE 2 TABLET Twice daily 2. Follow-up Schedule Surgery Office  Follow-up: Litotripsy for December.  KUB prior to surgery  Status: Hold For - Appointment  Requested for:  17Sep2015 Nephrolithiasis  3. KUB; Status:Hold For - Exact Date,Appointment,Date of Service;  Requested ZOX:WRUEAV 17Dec2015;   1. Continue weight loss, continue drinking increased amount of water.  2. Schedule lithotripsy for December  3. KUB prior to lithotripsy, for stone size ans location, measurement of stone to flank; and HU measurements.   4. Begin potassium citrate to try to prevent more stone from forming.

## 2014-05-12 ENCOUNTER — Encounter (HOSPITAL_COMMUNITY)
Admission: RE | Disposition: A | Discharge status: Home / Self Care | Payer: Self-pay | Source: Ambulatory Visit | Attending: Urology

## 2014-05-12 ENCOUNTER — Encounter (HOSPITAL_COMMUNITY): Payer: Self-pay | Admitting: General Practice

## 2014-05-12 ENCOUNTER — Ambulatory Visit (HOSPITAL_COMMUNITY): Payer: BC Managed Care – PPO

## 2014-05-12 ENCOUNTER — Ambulatory Visit (HOSPITAL_COMMUNITY)
Admission: RE | Admit: 2014-05-12 | Discharge: 2014-05-12 | Disposition: A | Discharge status: Home / Self Care | Payer: BC Managed Care – PPO | Source: Ambulatory Visit | Attending: Urology | Admitting: Urology

## 2014-05-12 DIAGNOSIS — Q631 Lobulated, fused and horseshoe kidney: Secondary | ICD-10-CM | POA: Diagnosis not present

## 2014-05-12 DIAGNOSIS — N200 Calculus of kidney: Secondary | ICD-10-CM | POA: Diagnosis present

## 2014-05-12 DIAGNOSIS — E780 Pure hypercholesterolemia: Secondary | ICD-10-CM | POA: Diagnosis not present

## 2014-05-12 DIAGNOSIS — E119 Type 2 diabetes mellitus without complications: Secondary | ICD-10-CM | POA: Diagnosis not present

## 2014-05-12 DIAGNOSIS — Z87891 Personal history of nicotine dependence: Secondary | ICD-10-CM | POA: Diagnosis not present

## 2014-05-12 LAB — GLUCOSE, CAPILLARY: GLUCOSE-CAPILLARY: 92 mg/dL (ref 70–99)

## 2014-05-12 SURGERY — LITHOTRIPSY, ESWL
Anesthesia: LOCAL | Laterality: Left

## 2014-05-12 MED ORDER — DIPHENHYDRAMINE HCL 25 MG PO CAPS
25.0000 mg | ORAL_CAPSULE | ORAL | Status: AC
  Administered 2014-05-12: 25 mg via ORAL
  Filled 2014-05-12: qty 1

## 2014-05-12 MED ORDER — DEXTROSE-NACL 5-0.45 % IV SOLN
INTRAVENOUS | Status: DC
  Administered 2014-05-12: 07:00:00 via INTRAVENOUS

## 2014-05-12 MED ORDER — HYDROCODONE-ACETAMINOPHEN 5-325 MG PO TABS: 1.0000 | ORAL_TABLET | Freq: Four times a day (QID) | ORAL | Status: DC | PRN

## 2014-05-12 MED ORDER — DIAZEPAM 5 MG PO TABS
10.0000 mg | ORAL_TABLET | ORAL | Status: AC
  Administered 2014-05-12: 10 mg via ORAL
  Filled 2014-05-12: qty 2

## 2014-05-12 MED ORDER — CIPROFLOXACIN HCL 500 MG PO TABS
500.0000 mg | ORAL_TABLET | ORAL | Status: AC
  Administered 2014-05-12: 500 mg via ORAL
  Filled 2014-05-12: qty 1

## 2014-05-12 NOTE — Interval H&P Note (Signed)
History and Physical Interval Note:  05/12/2014 8:05 AM  Rodney Steele  has presented today for surgery, with the diagnosis of left renal stones  The various methods of treatment have been discussed with the patient and family. After consideration of risks, benefits and other options for treatment, the patient has consented to  Procedure(s): LEFT EXTRACORPOREAL SHOCK WAVE LITHOTRIPSY (ESWL) (Left) as a surgical intervention .  The patient's history has been reviewed, patient examined, no change in status, stable for surgery.  I have reviewed the patient's chart and labs.  Questions were answered to the patient's satisfaction.     Aaradhya Kysar S

## 2014-05-12 NOTE — Op Note (Signed)
See [facility] OP note scanned into chart. 

## 2014-05-12 NOTE — Discharge Instructions (Signed)
See [facility] discharge instructions in chart.  

## 2014-06-05 ENCOUNTER — Encounter (HOSPITAL_COMMUNITY): Payer: Self-pay | Admitting: Gastroenterology

## 2014-07-18 ENCOUNTER — Encounter: Payer: Self-pay | Admitting: *Deleted

## 2014-08-08 ENCOUNTER — Ambulatory Visit: Payer: BC Managed Care – PPO | Admitting: Diagnostic Neuroimaging

## 2014-08-12 ENCOUNTER — Encounter: Payer: Self-pay | Admitting: Diagnostic Neuroimaging

## 2014-08-12 ENCOUNTER — Ambulatory Visit (INDEPENDENT_AMBULATORY_CARE_PROVIDER_SITE_OTHER): Payer: BC Managed Care – PPO | Admitting: Diagnostic Neuroimaging

## 2014-08-12 VITALS — BP 117/73 | HR 60 | Ht 70.0 in | Wt 217.8 lb

## 2014-08-12 DIAGNOSIS — G40209 Localization-related (focal) (partial) symptomatic epilepsy and epileptic syndromes with complex partial seizures, not intractable, without status epilepticus: Secondary | ICD-10-CM | POA: Diagnosis not present

## 2014-08-12 MED ORDER — LEVETIRACETAM 500 MG PO TABS: 500.0000 mg | ORAL_TABLET | Freq: Two times a day (BID) | ORAL | Status: DC

## 2014-08-12 NOTE — Patient Instructions (Signed)
Continue levetiracetam 500mg twice a day. 
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
## 2015-08-11 ENCOUNTER — Encounter: Payer: Self-pay | Admitting: Diagnostic Neuroimaging

## 2015-08-11 ENCOUNTER — Ambulatory Visit (INDEPENDENT_AMBULATORY_CARE_PROVIDER_SITE_OTHER): Payer: BC Managed Care – PPO | Admitting: Diagnostic Neuroimaging

## 2015-08-11 VITALS — BP 118/69 | HR 72 | Ht 70.0 in | Wt 251.0 lb

## 2015-08-11 DIAGNOSIS — G40209 Localization-related (focal) (partial) symptomatic epilepsy and epileptic syndromes with complex partial seizures, not intractable, without status epilepticus: Secondary | ICD-10-CM

## 2015-08-11 MED ORDER — LEVETIRACETAM 500 MG PO TABS: 500.0000 mg | ORAL_TABLET | Freq: Two times a day (BID) | ORAL | Status: DC
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
## 2015-08-21 ENCOUNTER — Other Ambulatory Visit: Payer: Self-pay | Admitting: Diagnostic Neuroimaging

## 2015-10-01 IMAGING — CR DG ABDOMEN 1V
1 series · 1 of 1 positions shown · non-contrast
Comparison: KUB film dated May 09, 2014

CLINICAL DATA: Preoperative study ; known left-sided kidney stones

EXAM:
ABDOMEN - 1 VIEW

[t abdomen supine]
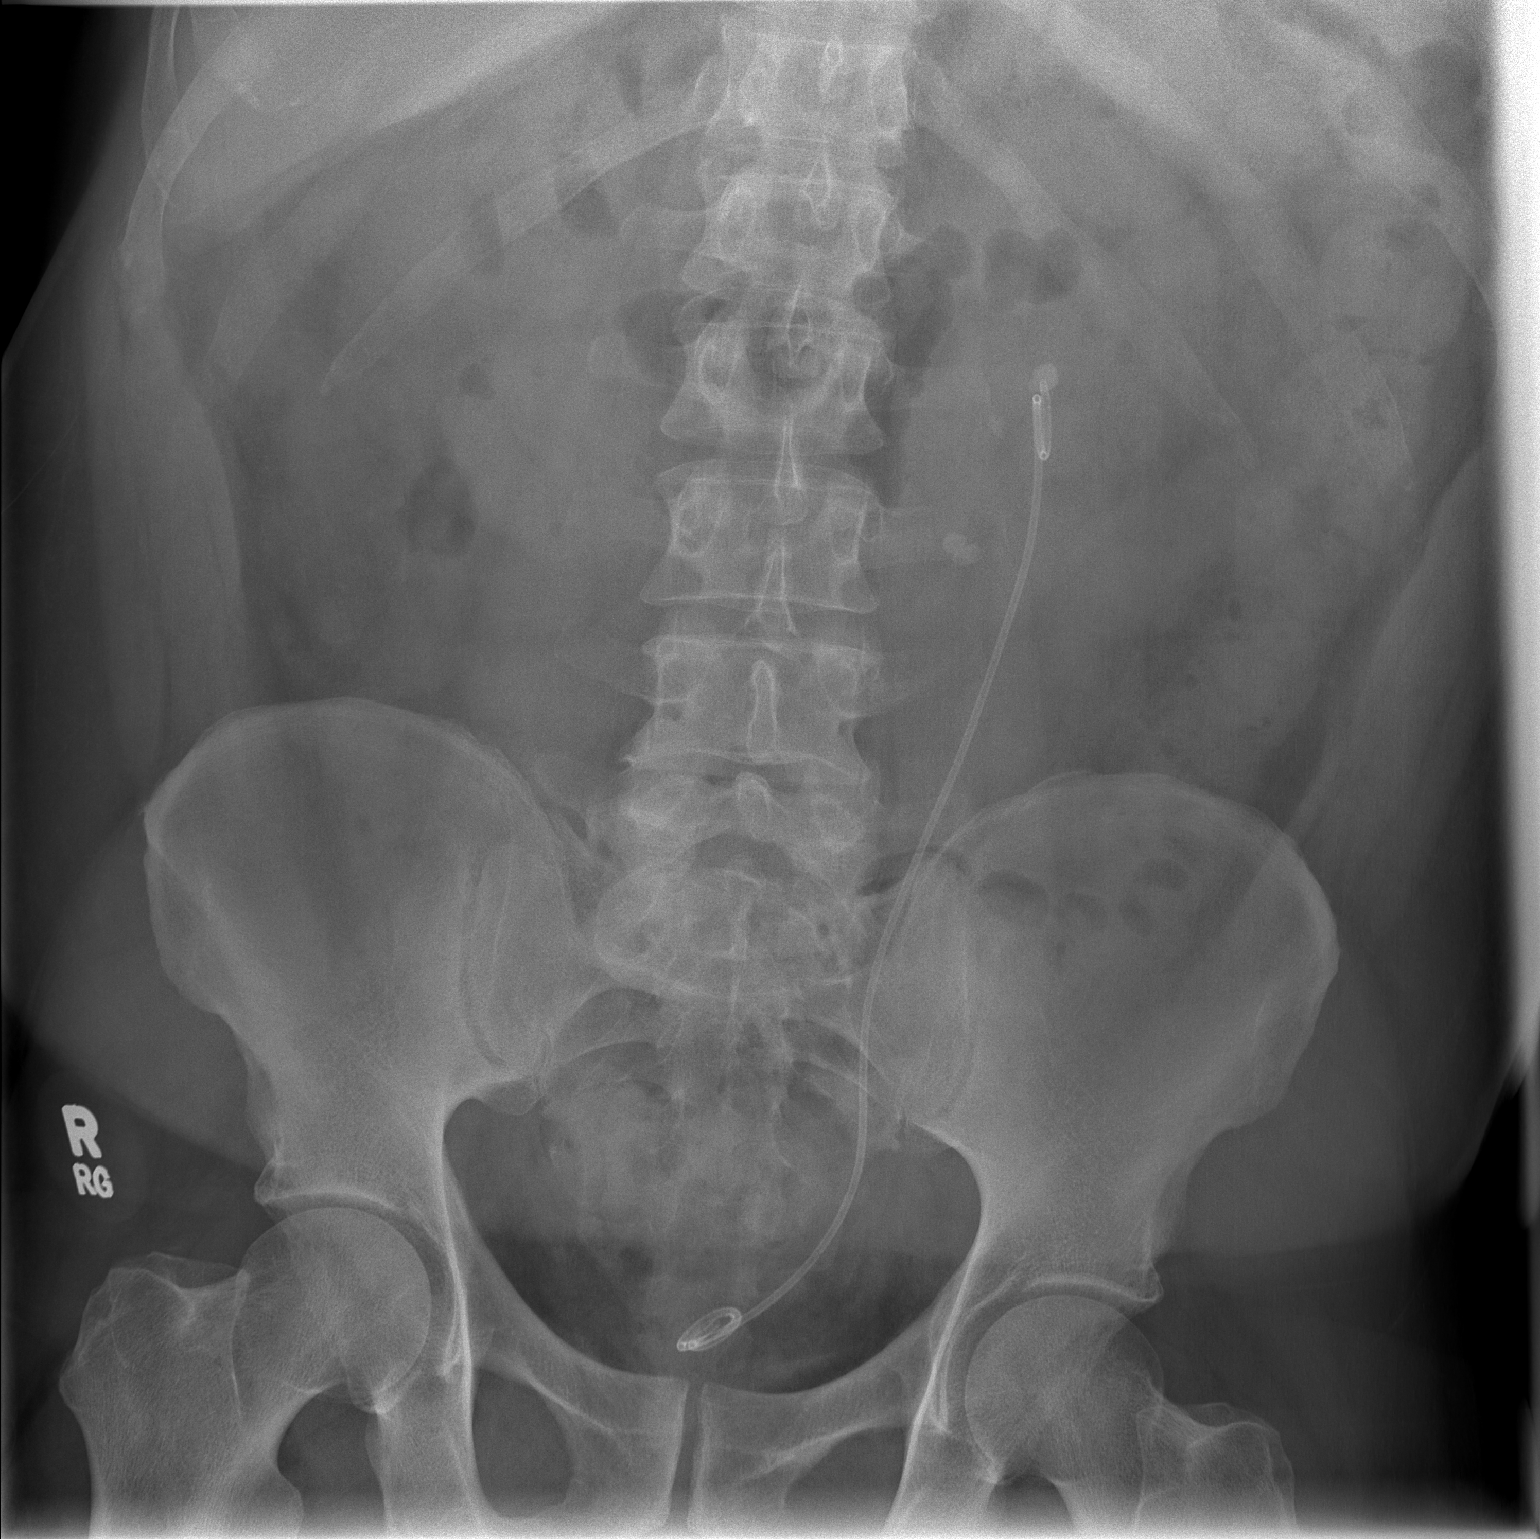

[1 of 1 positions shown; findings below may reference images not displayed]

FINDINGS: The pigtail ureteral stent on the left is unchanged in position.
There are coarse stones which project in the midportion of the renal
pelvis as well as at the level of the ureteropelvic junction. No
stones are evident more distally. No right-sided kidney stones are
demonstrated. The bowel gas pattern is unremarkable.
IMPRESSION: At least 4 kidney stones are visible on the left. The left ureteral
stent is unchanged in position.

## 2016-01-10 ENCOUNTER — Encounter (HOSPITAL_COMMUNITY): Payer: Self-pay | Admitting: *Deleted

## 2016-01-10 ENCOUNTER — Emergency Department (HOSPITAL_COMMUNITY)
Admission: EM | Admit: 2016-01-10 | Discharge: 2016-01-10 | Disposition: A | Discharge status: Home / Self Care | Payer: BC Managed Care – PPO | Attending: Emergency Medicine | Admitting: Emergency Medicine

## 2016-01-10 DIAGNOSIS — Y929 Unspecified place or not applicable: Secondary | ICD-10-CM | POA: Insufficient documentation

## 2016-01-10 DIAGNOSIS — Y999 Unspecified external cause status: Secondary | ICD-10-CM | POA: Insufficient documentation

## 2016-01-10 DIAGNOSIS — S39012A Strain of muscle, fascia and tendon of lower back, initial encounter: Secondary | ICD-10-CM | POA: Diagnosis not present

## 2016-01-10 DIAGNOSIS — Z79899 Other long term (current) drug therapy: Secondary | ICD-10-CM | POA: Insufficient documentation

## 2016-01-10 DIAGNOSIS — Y9389 Activity, other specified: Secondary | ICD-10-CM | POA: Diagnosis not present

## 2016-01-10 DIAGNOSIS — Z87891 Personal history of nicotine dependence: Secondary | ICD-10-CM | POA: Diagnosis not present

## 2016-01-10 DIAGNOSIS — X500XXA Overexertion from strenuous movement or load, initial encounter: Secondary | ICD-10-CM | POA: Insufficient documentation

## 2016-01-10 DIAGNOSIS — S3992XA Unspecified injury of lower back, initial encounter: Secondary | ICD-10-CM | POA: Diagnosis present

## 2016-01-10 DIAGNOSIS — E119 Type 2 diabetes mellitus without complications: Secondary | ICD-10-CM | POA: Insufficient documentation

## 2016-01-10 MED ORDER — KETOROLAC TROMETHAMINE 60 MG/2ML IM SOLN
60.0000 mg | Freq: Once | INTRAMUSCULAR | Status: AC
  Administered 2016-01-10: 60 mg via INTRAMUSCULAR
  Filled 2016-01-10: qty 2

## 2016-01-10 MED ORDER — OXYCODONE-ACETAMINOPHEN 5-325 MG PO TABS
1.0000 | ORAL_TABLET | Freq: Once | ORAL | Status: AC
  Administered 2016-01-10: 1 via ORAL
  Filled 2016-01-10: qty 1

## 2016-01-10 MED ORDER — METHOCARBAMOL 500 MG PO TABS: 500.0000 mg | ORAL_TABLET | Freq: Two times a day (BID) | ORAL | 0 refills | Status: DC

## 2016-01-10 MED ORDER — NAPROXEN 500 MG PO TABS: 500.0000 mg | ORAL_TABLET | Freq: Two times a day (BID) | ORAL | 0 refills | Status: DC

## 2016-01-10 MED ORDER — HYDROMORPHONE HCL 1 MG/ML IJ SOLN
1.0000 mg | Freq: Once | INTRAMUSCULAR | Status: AC
  Administered 2016-01-10: 1 mg via INTRAMUSCULAR
  Filled 2016-01-10: qty 1

## 2016-01-10 NOTE — ED Triage Notes (Signed)
Pt reports severe back pain after lifting heavy object (moving a treadmill) an hour ago. He reports weakness in left leg, unable to sit down due to pain

## 2016-01-10 NOTE — Discharge Instructions (Signed)
Apply ice to affected area. Take Robaxin and naprosyn as needed for pain. Exercise and stretch muscle. Follow up with your PCP if your symptoms do not improve. Return to the ED if you experience severe worsening of your symptoms, loss of control of your bowel and bladder, numbness or tingling in both of your lower extremities, difficulty urinating, inability to walk.

## 2016-01-10 NOTE — ED Notes (Signed)
Pt reports taking flexeril prior to coming to ED.  Sts no relief.  Pt noted to stand and ambulate in hallway w/o assistance.
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
## 2016-08-10 ENCOUNTER — Ambulatory Visit: Payer: BC Managed Care – PPO | Admitting: Diagnostic Neuroimaging

## 2016-08-11 ENCOUNTER — Encounter: Payer: Self-pay | Admitting: Diagnostic Neuroimaging

## 2016-08-22 ENCOUNTER — Telehealth: Payer: Self-pay | Admitting: Diagnostic Neuroimaging

## 2016-08-22 MED ORDER — LEVETIRACETAM 500 MG PO TABS: 500.0000 mg | ORAL_TABLET | Freq: Two times a day (BID) | ORAL | 1 refills | Status: DC

## 2016-08-22 NOTE — Telephone Encounter (Signed)
Patient called office requesting refill for levETIRAcetam (KEPPRA) 500 MG tablet.

## 2016-08-22 NOTE — Addendum Note (Signed)
Addended byHermenia Fiscal on: 08/22/2016 03:22 PM   Modules accepted: Orders

## 2016-08-23 DIAGNOSIS — Z0289 Encounter for other administrative examinations: Secondary | ICD-10-CM

## 2016-08-24 ENCOUNTER — Other Ambulatory Visit: Payer: Self-pay | Admitting: Diagnostic Neuroimaging

## 2016-12-07 ENCOUNTER — Encounter (INDEPENDENT_AMBULATORY_CARE_PROVIDER_SITE_OTHER): Payer: Self-pay

## 2016-12-07 ENCOUNTER — Ambulatory Visit (INDEPENDENT_AMBULATORY_CARE_PROVIDER_SITE_OTHER): Payer: BC Managed Care – PPO | Admitting: Diagnostic Neuroimaging

## 2016-12-07 ENCOUNTER — Encounter: Payer: Self-pay | Admitting: Diagnostic Neuroimaging

## 2016-12-07 VITALS — BP 120/72 | HR 76 | Ht 71.0 in | Wt 279.0 lb

## 2016-12-07 DIAGNOSIS — G40209 Localization-related (focal) (partial) symptomatic epilepsy and epileptic syndromes with complex partial seizures, not intractable, without status epilepticus: Secondary | ICD-10-CM

## 2016-12-07 MED ORDER — LEVETIRACETAM 750 MG PO TABS: 750.0000 mg | ORAL_TABLET | Freq: Two times a day (BID) | ORAL | 4 refills | Status: DC

## 2016-12-07 NOTE — Patient Instructions (Signed)
-   increase levetiracetam to 750mg  twice a day  - consider sleep study
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
## 2017-02-15 ENCOUNTER — Other Ambulatory Visit: Payer: Self-pay | Admitting: Diagnostic Neuroimaging

## 2017-12-11 ENCOUNTER — Ambulatory Visit: Payer: BC Managed Care – PPO | Admitting: Diagnostic Neuroimaging

## 2017-12-11 ENCOUNTER — Telehealth: Payer: Self-pay | Admitting: *Deleted

## 2017-12-11 NOTE — Telephone Encounter (Signed)
Pt no showed for today's appt

## 2017-12-13 ENCOUNTER — Encounter: Payer: Self-pay | Admitting: Diagnostic Neuroimaging

## 2018-01-16 ENCOUNTER — Telehealth: Payer: Self-pay | Admitting: Diagnostic Neuroimaging

## 2018-01-16 NOTE — Telephone Encounter (Signed)
I contacted Katy with Dr. Zachery DauerBarnes office and requested the recent lipid panel to be faxed for MD to review.  Labs placed on MD's desk.

## 2018-01-16 NOTE — Telephone Encounter (Signed)
Katy/Dr [facility] [employer] [phone] called pt has elevated lipids, can he be on lipitor since he has seizure disorder? Please call to advise

## 2018-01-16 NOTE — Telephone Encounter (Signed)
Requested a call back from Sk[place]Katy with Dr. Zachery DauerBarnes office.

## 2018-01-16 NOTE — Telephone Encounter (Signed)
Katy with Dr. Zachery DauerBarnes office notified it would be ok per Dr. Marjory LiesPenumalli to start lipitor. She voiced understanding and had no further questions at this time. MB RN.

## 2018-01-16 NOTE — Telephone Encounter (Signed)
Ok to start lipitor from my standpoint. -VRP

## 2018-03-01 ENCOUNTER — Other Ambulatory Visit: Payer: Self-pay | Admitting: Diagnostic Neuroimaging

## 2018-05-30 ENCOUNTER — Other Ambulatory Visit: Payer: Self-pay | Admitting: Diagnostic Neuroimaging

## 2018-05-31 ENCOUNTER — Other Ambulatory Visit: Payer: Self-pay | Admitting: Diagnostic Neuroimaging

## 2018-05-31 MED ORDER — LEVETIRACETAM 750 MG PO TABS: 750.0000 mg | ORAL_TABLET | Freq: Two times a day (BID) | ORAL | 0 refills | Status: DC

## 2018-05-31 NOTE — Telephone Encounter (Addendum)
Rx for Keppra 750 mg bid submitted based on the fact pt has scheduled f/u with Dr. Marjory Lies. (10/08/18 at 4 pm)

## 2018-05-31 NOTE — Addendum Note (Signed)
Addended by: Ann MakiBAILEY, Suzzane Quilter T on: 05/31/2018 02:02 PM   Modules accepted: Orders

## 2018-05-31 NOTE — Telephone Encounter (Signed)
Pt wife(Tammy) has called for refill on levETIRAcetam (KEPPRA) 750 MG tablet [phone] pt has scheduled 1 yr f/u

## 2018-06-23 HISTORY — PX: KNEE ARTHROSCOPY W/ MENISCAL REPAIR: SHX1877

## 2018-07-10 ENCOUNTER — Encounter (HOSPITAL_COMMUNITY): Payer: Self-pay

## 2018-07-10 ENCOUNTER — Emergency Department (HOSPITAL_COMMUNITY)
Admission: EM | Admit: 2018-07-10 | Discharge: 2018-07-10 | Disposition: A | Discharge status: Home / Self Care | Payer: BC Managed Care – PPO | Attending: Emergency Medicine | Admitting: Emergency Medicine

## 2018-07-10 DIAGNOSIS — E119 Type 2 diabetes mellitus without complications: Secondary | ICD-10-CM | POA: Diagnosis not present

## 2018-07-10 DIAGNOSIS — Z79899 Other long term (current) drug therapy: Secondary | ICD-10-CM | POA: Insufficient documentation

## 2018-07-10 DIAGNOSIS — M25561 Pain in right knee: Secondary | ICD-10-CM | POA: Diagnosis present

## 2018-07-10 DIAGNOSIS — Z87891 Personal history of nicotine dependence: Secondary | ICD-10-CM | POA: Insufficient documentation

## 2018-07-10 MED ORDER — HYDROCODONE-ACETAMINOPHEN 5-325 MG PO TABS: 1.0000 | ORAL_TABLET | Freq: Four times a day (QID) | ORAL | 0 refills | Status: DC | PRN

## 2018-07-10 MED ORDER — KETOROLAC TROMETHAMINE 15 MG/ML IJ SOLN
15.0000 mg | Freq: Once | INTRAMUSCULAR | Status: AC
  Administered 2018-07-10: 15 mg via INTRAMUSCULAR
  Filled 2018-07-10: qty 1

## 2018-07-10 MED ORDER — OXYCODONE-ACETAMINOPHEN 5-325 MG PO TABS
1.0000 | ORAL_TABLET | Freq: Once | ORAL | Status: AC
  Administered 2018-07-10: 1 via ORAL
  Filled 2018-07-10: qty 1

## 2018-07-10 NOTE — ED Triage Notes (Signed)
Pt arrived stating he was recently seen for right knee pain, after x-ray dr states it was arthritis. Pt arrived at work this morning and had difficulty walking inside, at one point states his knee gave out. Pt did not fall.

## 2018-07-10 NOTE — ED Provider Notes (Signed)
[facility]   CSN: [id number] Arrival date & time: 07/10/18  0602    History   Chief Complaint Chief Complaint  Patient presents with  . Knee Pain    HPI Rodney Steele is a 53 y.o. male presenting for evaluation for R knee pain.    Pt states he has had worsened R knee pain for the past 2 weeks. He went to his ortho office ([facility]) 4 days ago and had an xray with showed arthritis. Started on ibuprofen/famotidine combo pill, has been taking TID since without improvement. Today he was at work when he felt his knee "slip" and had worsened knee pain since. It is constant, worse with movement and palpation. Nothing makes it better. He denies fevers, chill, numbness, or tingling.  He has a h/o htn for which he takes medication, no other medical problems.  Pt works in housekeeping at [employer], performs frequent Chief Executive Officer,   HPI  Past Medical History:  Diagnosis Date  . Diabetes mellitus without complication (HCC) 11/05/2013   NEW DIAGNOSIS  . Epilepsy (HCC)   . GI bleed 11/06/2013  . Kidney stones   . Kidney stones 03/25/2014    Patient Active Problem List   Diagnosis Date Noted  . Kidney stones 03/25/2014  . Thrombocytopenia, unspecified (HCC) 11/07/2013  . Rectal bleed 11/06/2013  . Hydronephrosis 11/06/2013  . Hyperglycemia 11/06/2013  . Seizures (HCC) 11/06/2013  . GI bleed 11/06/2013    Past Surgical History:  Procedure Laterality Date  . CYSTOSCOPY WITH RETROGRADE PYELOGRAM, URETEROSCOPY AND STENT PLACEMENT Left 11/08/2013   Procedure: CYSTOSCOPY WITH LEFT RETROGRADE PYELOGRAM, AND STENT PLACEMENT;  Surgeon: Garnett Farm, MD;  Location: [facility];  Service: Urology;  Laterality: Left;  . HAND SURGERY  20 + years    x2, re-attached and bone graft  . KNEE ARTHROSCOPY Right    15- years ago  . right thumb   12 years ago   re-attached tendon in thumb        Home Medications    Prior to Admission  medications   Medication Sig Start Date End Date Taking? Authorizing Provider  busPIRone (BUSPAR) 10 MG tablet Take 10 mg by mouth 2 (two) times daily.    [provider]  HYDROcodone-acetaminophen (NORCO/VICODIN) 5-325 MG tablet Take 1 tablet by mouth every 6 (six) hours as needed for severe pain. 07/10/18   Emersen Mascari, PA-C  levETIRAcetam (KEPPRA) 750 MG tablet Take 1 tablet (750 mg total) by mouth 2 (two) times daily. 05/31/18   Penumalli, Glenford Bayley, MD  Multiple Vitamins-Minerals (MULTIVITAMIN PO) Take 1 tablet by mouth daily.    [provider]    Family History Family History  Problem Relation Age of Onset  . Lymphoma Mother   . Other Father        "heavy smoker"  . Stroke Paternal Uncle        several uncles had stroke    Social History Social History   Tobacco Use  . Smoking status: Former Smoker    Packs/day: 1.00    Years: 15.00    Pack years: 15.00    Types: Cigarettes    Last attempt to quit: 05/23/1996    Years since quitting: 22.1  . Smokeless tobacco: Never Used  Substance Use Topics  . Alcohol use: Yes    Alcohol/week: 0.0 standard drinks    Comment: 1 mixed drink weekly  . Drug use: No     Allergies  Penicillins   Review of Systems Review of Systems  Musculoskeletal: Positive for arthralgias and joint swelling.  Neurological: Negative for numbness.     Physical Exam Updated Vital Signs BP 102/80   Pulse (!) 56   Temp (!) 97.4 F (36.3 C) (Oral)   Resp 18   Ht 5\' 10"  (1.778 m)   Wt 131.5 kg   SpO2 99%   BMI 41.61 kg/m   Physical Exam Vitals signs and nursing note reviewed.  Constitutional:      General: He is not in acute distress.    Appearance: He is well-developed.     Comments: Appears nontoxic  HENT:     Head: Normocephalic and atraumatic.  Neck:     Musculoskeletal: Normal range of motion.  Pulmonary:     Effort: Pulmonary effort is normal.  Abdominal:     General: There is no distension.   Musculoskeletal:        General: Tenderness present.     Comments: Minimal swelling of the R knee. ttp of the anterior knee, increased on the medial aspect. No erythema or warmth. No ttp of calf or thigh. Pain with varus and valgus stress. Sensation intact bilaterally  Skin:    General: Skin is warm.     Capillary Refill: Capillary refill takes less than 2 seconds.     Findings: No rash.  Neurological:     Mental Status: He is alert and oriented to person, place, and time.      ED Treatments / Results  Labs (all labs ordered are listed, but only abnormal results are displayed) Labs Reviewed - No data to display  EKG None  Radiology No results found.  Procedures Procedures (including critical care time)  Medications Ordered in ED Medications  oxyCODONE-acetaminophen (PERCOCET/ROXICET) 5-325 MG per tablet 1 tablet (1 tablet Oral Given 07/10/18 0847)  ketorolac (TORADOL) 15 MG/ML injection 15 mg (15 mg Intramuscular Given 07/10/18 1035)     Initial Impression / Assessment and Plan / ED Course  I have reviewed the triage vital signs and the nursing notes.  Pertinent labs & imaging results that were available during my care of the patient were reviewed by me and considered in my medical decision making (see chart for details).        Pt presenting for evaluation of R knee pain. physical exam reassuring, he is neurovascularly intact. Exam not consistent with infection or septic joint. Pt with xray 4 days ago, I do not believe repeat will be beneficial, as there was no new fall or injury. Consider arthistis vs ligamentous injury vs meniscal injury. Will tx symptomatically. Pt unable to ambulate due to pain, will given short course of pain education. PMP checked, pt without concerning controlled substance rx. At this time, pt appears safe for d/c. Return precautions given. Pt states he understands and agrees to plan.   Final Clinical Impressions(s) / ED Diagnoses   Final  diagnoses:  Acute pain of right knee    ED Discharge Orders         Ordered    HYDROcodone-acetaminophen (NORCO/VICODIN) 5-325 MG tablet  Every 6 hours PRN     07/10/18 1040           Blaize Epple, PA-C 07/10/18 2027    Pricilla Loveless, MD 07/13/18 1530

## 2018-07-10 NOTE — ED Notes (Signed)
Ortho tech called for knee immobilizer and crutches 

## 2018-07-10 NOTE — Discharge Instructions (Addendum)
Continue take medication as prescribed. Use the knee immobilizer for symptom control.  Use crutches as needed for severe pain. Follow-up with your orthopedic doctor for further evaluation and management of your knee. Return to the emergency room if you develop fevers, you are unable to feel your foot, or with any new, worsening, or concerning symptoms.

## 2018-07-10 NOTE — ED Notes (Signed)
Bed: WA06 Expected date:  Expected time:  Means of arrival:  Comments: 

## 2018-07-17 ENCOUNTER — Encounter: Payer: Self-pay | Admitting: *Deleted

## 2018-07-17 ENCOUNTER — Telehealth: Payer: Self-pay | Admitting: *Deleted

## 2018-07-17 NOTE — Telephone Encounter (Addendum)
Received from [facility]-  surgical clearance form re: right knee scope on 07/19/18.  I spoke with patient, and he stated he has been and is taking levetiracetam as prescribed. He denied having any seizures or seizure activity since he was last seen in this office July 2018.  I advised ihm based on this information Dr Marjory Lies will send in clearance, and advised he must keep FU in May in order to continue to get refills on medication.. Patient verbalized understanding, appreciation. Sent patient a my chart advising he continue to take levetiracetam on day of surgery.  Surgical clearance form completed, signed by Dr Marjory Lies and successfully faxed to [facility].

## 2018-09-17 ENCOUNTER — Encounter: Payer: Self-pay | Admitting: *Deleted

## 2018-09-17 ENCOUNTER — Telehealth: Payer: Self-pay | Admitting: *Deleted

## 2018-09-17 NOTE — Telephone Encounter (Signed)
Called patient and advised him that due to current COVID 19 pandemic, our office is severely reducing in person visits in order to minimize the risk to our patients and healthcare providers. We recommend to convert your appointment to a video visit. We'll take all precautions to reduce any security or privacy concerns. This will be treated like an office visit, and we will file with your insurance. He consented to [employer]. Pt's email is [email or web address]. Pt understands that the [employer] webex software must be downloaded and operational on the device pt plans to use for the visit. Updated EMR; he verbalized understanding, appreciation. Webex scheduled; e mail sent.

## 2018-09-18 ENCOUNTER — Other Ambulatory Visit: Payer: Self-pay

## 2018-09-18 ENCOUNTER — Ambulatory Visit (INDEPENDENT_AMBULATORY_CARE_PROVIDER_SITE_OTHER): Payer: BC Managed Care – PPO | Admitting: Diagnostic Neuroimaging

## 2018-09-18 ENCOUNTER — Encounter: Payer: Self-pay | Admitting: Diagnostic Neuroimaging

## 2018-09-18 DIAGNOSIS — G40209 Localization-related (focal) (partial) symptomatic epilepsy and epileptic syndromes with complex partial seizures, not intractable, without status epilepticus: Secondary | ICD-10-CM | POA: Diagnosis not present

## 2018-09-18 MED ORDER — LEVETIRACETAM 750 MG PO TABS: 750.0000 mg | ORAL_TABLET | Freq: Two times a day (BID) | ORAL | 4 refills | Status: DC

## 2018-09-18 NOTE — Progress Notes (Signed)
     Virtual Visit via Video Note  I connected with Rodney Steele on 09/18/18 at  8:30 AM EDT by a video enabled telemedicine application and verified that I am speaking with the correct person using two identifiers.   I discussed the limitations of evaluation and management by telemedicine and the availability of in person appointments. The patient expressed understanding and agreed to proceed.  History of Present Illness:  -Patient is doing well and no seizures.  Tolerating higher dose of levetiracetam 750 mg twice a day. -Since last visit had knee surgery which was successful. -Also on statin now.    Observations/Objective:   VIDEO EXAM  GENERAL EXAM/CONSTITUTIONAL:  Vitals: There were no vitals filed for this visit.  There is no height or weight on file to calculate BMI. Wt Readings from Last 3 Encounters:  07/10/18 290 lb (131.5 kg)  12/07/16 279 lb (126.6 kg)  01/10/16 260 lb (117.9 kg)     Patient is in no distress; well developed, nourished and groomed; neck is supple   NEUROLOGIC: MENTAL STATUS:  No flowsheet data found.  awake, alert, oriented to person, place and time  recent and remote memory intact  normal attention and concentration  language fluent, comprehension intact, naming intact  fund of knowledge appropriate  CRANIAL NERVE:   2nd, 3rd, 4th, 6th - visual fields full to confrontation, extraocular muscles intact, no nystagmus  5th - facial sensation symmetric  7th - facial strength symmetric  8th - hearing intact  11th - shoulder shrug symmetric  12th - tongue protrusion midline  MOTOR:   NO TREMOR; NO DRIFT IN BUE  SENSORY:   normal and symmetric to light touch  COORDINATION:   fine finger movements normal   Assessment and Plan:  53 y.o. male here with partial seizures; last seizure 2010. Some minor spells up to 2015, then event free. Some more subtle spells in 2018.  Dx: Right frontal / temporal localization of  partial seizures  1. Partial symptomatic epilepsy with complex partial seizures, not intractable, without status epilepticus (HCC)      PLAN:  - continue levetiracetam 750mg  twice a day - seizure precautions reviewed - consider sleep study (snoring, interrupted sleep, weight gain); patient will let us know  Meds ordered this encounter  Medications  . levETIRAcetam (KEPPRA) 750 MG tablet    Sig: Take 1 tablet (750 mg total) by mouth 2 (two) times daily.    Dispense:  180 tablet    Refill:  4    Follow Up Instructions:  - Return in about 9 months (around 06/20/2019).     I discussed the assessment and treatment plan with the patient. The patient was provided an opportunity to ask questions and all were answered. The patient agreed with the plan and demonstrated an understanding of the instructions.   The patient was advised to call back or seek an in-person evaluation if the symptoms worsen or if the condition fails to improve as anticipated.  I provided 15 minutes of non-face-to-face time during this encounter.   Suanne Marker, MD 09/18/2018, 8:51 AM Certified in Neurology, Neurophysiology and Neuroimaging  [facility] [address], [address] [place], [place] [address] [phone]

## 2018-09-19 ENCOUNTER — Telehealth: Payer: Self-pay | Admitting: Diagnostic Neuroimaging

## 2018-09-19 NOTE — Telephone Encounter (Signed)
09/19/18 - LVM to schedule 9 month follow-up with Dr. Marjory Lies

## 2018-10-08 ENCOUNTER — Ambulatory Visit: Payer: BC Managed Care – PPO | Admitting: Diagnostic Neuroimaging

## 2018-11-08 ENCOUNTER — Telehealth: Payer: Self-pay | Admitting: *Deleted

## 2018-11-08 NOTE — Telephone Encounter (Signed)
Received faxed request for surgery clearance for right total knee replacement, Dr Alain Marion. Fax was completed, signed by Dr Leta Baptist. Release and most recent note successfully faxed to [place].

## 2019-02-21 HISTORY — PX: REPLACEMENT TOTAL KNEE: SUR1224

## 2019-12-17 ENCOUNTER — Other Ambulatory Visit: Payer: Self-pay | Admitting: Diagnostic Neuroimaging

## 2020-01-09 ENCOUNTER — Encounter: Payer: Self-pay | Admitting: *Deleted

## 2020-01-09 ENCOUNTER — Telehealth: Payer: Self-pay | Admitting: Diagnostic Neuroimaging

## 2020-01-09 ENCOUNTER — Other Ambulatory Visit: Payer: Self-pay | Admitting: Diagnostic Neuroimaging

## 2020-01-09 MED ORDER — LEVETIRACETAM 750 MG PO TABS: 750.0000 mg | ORAL_TABLET | Freq: Two times a day (BID) | ORAL | 1 refills | Status: DC

## 2020-01-09 NOTE — Telephone Encounter (Signed)
Patient has FU on 02/17/20, refilled keppra x 2 months with note to pharmacy: must keep FU.

## 2020-01-09 NOTE — Telephone Encounter (Signed)
Pt is needing a refill on his levETIRAcetam (KEPPRA) 750 MG tablet [facility] [address]

## 2020-02-08 ENCOUNTER — Other Ambulatory Visit: Payer: Self-pay | Admitting: Diagnostic Neuroimaging

## 2020-02-17 ENCOUNTER — Ambulatory Visit (INDEPENDENT_AMBULATORY_CARE_PROVIDER_SITE_OTHER): Payer: BC Managed Care – PPO | Admitting: Diagnostic Neuroimaging

## 2020-02-17 ENCOUNTER — Other Ambulatory Visit: Payer: Self-pay

## 2020-02-17 ENCOUNTER — Encounter: Payer: Self-pay | Admitting: Diagnostic Neuroimaging

## 2020-02-17 VITALS — BP 145/82 | HR 76 | Ht 71.0 in | Wt 273.2 lb

## 2020-02-17 DIAGNOSIS — G40209 Localization-related (focal) (partial) symptomatic epilepsy and epileptic syndromes with complex partial seizures, not intractable, without status epilepticus: Secondary | ICD-10-CM

## 2020-02-17 MED ORDER — LEVETIRACETAM 750 MG PO TABS: 750.0000 mg | ORAL_TABLET | Freq: Two times a day (BID) | ORAL | 4 refills | Status: DC

## 2020-02-17 NOTE — Patient Instructions (Signed)
SEIZURE DISORDER - continue levetiracetam 750mg  twice a day
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
## 2021-02-22 NOTE — Patient Instructions (Signed)
Below is our plan:  We will continue levetiracetam 750mg  twice daily  Please make sure you are staying well hydrated. I recommend 50-60 ounces daily. Well balanced diet and regular exercise encouraged. Consistent sleep schedule with 6-8 hours recommended.   Please continue follow up with care team as directed.   Follow up with me in  year   You may receive a survey regarding today's visit. I encourage you to leave honest feed back as I do use this information to improve patient care. Thank you for seeing me today!
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
## 2021-02-23 ENCOUNTER — Telehealth (INDEPENDENT_AMBULATORY_CARE_PROVIDER_SITE_OTHER): Payer: BC Managed Care – PPO | Admitting: Family Medicine

## 2021-02-23 ENCOUNTER — Encounter: Payer: Self-pay | Admitting: Family Medicine

## 2021-02-23 DIAGNOSIS — G40209 Localization-related (focal) (partial) symptomatic epilepsy and epileptic syndromes with complex partial seizures, not intractable, without status epilepticus: Secondary | ICD-10-CM | POA: Diagnosis not present

## 2021-02-23 MED ORDER — LEVETIRACETAM 750 MG PO TABS: 750.0000 mg | ORAL_TABLET | Freq: Two times a day (BID) | ORAL | 4 refills | Status: DC

## 2021-10-21 ENCOUNTER — Encounter: Payer: Self-pay | Admitting: Family Medicine

## 2021-12-21 ENCOUNTER — Telehealth: Payer: Self-pay | Admitting: Family Medicine

## 2021-12-21 NOTE — Telephone Encounter (Signed)
LVM and mychart msg informing pt of appointment change due to provider template change

## 2022-03-01 ENCOUNTER — Telehealth: Payer: BC Managed Care – PPO | Admitting: Family Medicine
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
## 2022-03-03 NOTE — Patient Instructions (Signed)
Below is our plan:  We will continue levetiracetam 750mg twice daily.   Please make sure you are consistent with timing of seizure medication. I recommend annual visit with primary care provider (PCP) for complete physical and routine blood work. I recommend daily intake of vitamin D (400-800iu) and calcium (800-1000mg) for bone health. Discuss Dexa screening with PCP.   According to [place] law, you can not drive unless you are seizure / syncope free for at least 6 months and under physician's care.  Please maintain precautions. Do not participate in activities where a loss of awareness could harm you or someone else. No swimming alone, no tub bathing, no hot tubs, no driving, no operating motorized vehicles (cars, ATVs, motocycles, etc), lawnmowers, power tools or firearms. No standing at heights, such as rooftops, ladders or stairs. Avoid hot objects such as stoves, heaters, open fires. Wear a helmet when riding a bicycle, scooter, skateboard, etc. and avoid areas of traffic. Set your water heater to 120 degrees or less.  Please make sure you are staying well hydrated. I recommend 50-60 ounces daily. Well balanced diet and regular exercise encouraged. Consistent sleep schedule with 6-8 hours recommended.   Please continue follow up with care team as directed.   Follow up with me in 1 year   You may receive a survey regarding today's visit. I encourage you to leave honest feed back as I do use this information to improve patient care. Thank you for seeing me today!    

## 2022-03-08 ENCOUNTER — Encounter: Payer: Self-pay | Admitting: Family Medicine

## 2022-03-08 ENCOUNTER — Telehealth: Payer: BC Managed Care – PPO | Admitting: Family Medicine

## 2022-03-08 DIAGNOSIS — G40209 Localization-related (focal) (partial) symptomatic epilepsy and epileptic syndromes with complex partial seizures, not intractable, without status epilepticus: Secondary | ICD-10-CM | POA: Diagnosis not present

## 2022-03-08 MED ORDER — LEVETIRACETAM 750 MG PO TABS: 750.0000 mg | ORAL_TABLET | Freq: Two times a day (BID) | ORAL | 4 refills | Status: DC
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
## 2023-03-09 NOTE — Patient Instructions (Signed)
Below is our plan:  We will continue levetiracetam 750mg  twice daily.   Please make sure you are consistent with timing of seizure medication. I recommend annual visit with primary care provider (PCP) for complete physical and routine blood work. I recommend daily intake of vitamin D (400-800iu) and calcium (800-1000mg ) for bone health. Discuss Dexa screening with PCP.   According to [place] law, you can not drive unless you are seizure / syncope free for at least 6 months and under physician's care.  Please maintain precautions. Do not participate in activities where a loss of awareness could harm you or someone else. No swimming alone, no tub bathing, no hot tubs, no driving, no operating motorized vehicles (cars, ATVs, motocycles, etc), lawnmowers, power tools or firearms. No standing at heights, such as rooftops, ladders or stairs. Avoid hot objects such as stoves, heaters, open fires. Wear a helmet when riding a bicycle, scooter, skateboard, etc. and avoid areas of traffic. Set your water heater to 120 degrees or less.  SUDEP is the sudden, unexpected death of someone with epilepsy, who was otherwise healthy. In SUDEP cases, no other cause of death is found when an autopsy is done. Each year, more than 1 in 1,000 people with epilepsy die from SUDEP. This is the leading cause of death in people with uncontrolled seizures. Until further answers are available, the best way to prevent SUDEP is to lower your risk by controlling seizures. Research has found that people with all types of epilepsy that experience convulsive seizures can be at risk.  Please make sure you are staying well hydrated. I recommend 50-60 ounces daily. Well balanced diet and regular exercise encouraged. Consistent sleep schedule with 6-8 hours recommended.   Please continue follow up with care team as directed.   Follow up with me in 1 year   You may receive a survey regarding today's visit. I encourage you to leave honest feed  back as I do use this information to improve patient care. Thank you for seeing me today!

## 2023-03-14 ENCOUNTER — Telehealth (INDEPENDENT_AMBULATORY_CARE_PROVIDER_SITE_OTHER): Payer: BC Managed Care – PPO | Admitting: Family Medicine

## 2023-03-14 ENCOUNTER — Encounter: Payer: Self-pay | Admitting: Family Medicine

## 2023-03-14 DIAGNOSIS — G40209 Localization-related (focal) (partial) symptomatic epilepsy and epileptic syndromes with complex partial seizures, not intractable, without status epilepticus: Secondary | ICD-10-CM | POA: Diagnosis not present

## 2023-03-14 MED ORDER — LEVETIRACETAM 750 MG PO TABS: 750.0000 mg | ORAL_TABLET | Freq: Two times a day (BID) | ORAL | 4 refills | Status: DC

## 2024-03-13 NOTE — Patient Instructions (Signed)
Below is our plan:  We will continue levetiracetam 750mg  twice daily.   Please make sure you are consistent with timing of seizure medication. I recommend annual visit with primary care provider (PCP) for complete physical and routine blood work. I recommend daily intake of vitamin D (400-800iu) and calcium (800-1000mg ) for bone health. Discuss Dexa screening with PCP.   According to [place] law, you can not drive unless you are seizure / syncope free for at least 6 months and under physician's care.  Please maintain precautions. Do not participate in activities where a loss of awareness could harm you or someone else. No swimming alone, no tub bathing, no hot tubs, no driving, no operating motorized vehicles (cars, ATVs, motocycles, etc), lawnmowers, power tools or firearms. No standing at heights, such as rooftops, ladders or stairs. Avoid hot objects such as stoves, heaters, open fires. Wear a helmet when riding a bicycle, scooter, skateboard, etc. and avoid areas of traffic. Set your water heater to 120 degrees or less.  SUDEP is the sudden, unexpected death of someone with epilepsy, who was otherwise healthy. In SUDEP cases, no other cause of death is found when an autopsy is done. Each year, more than 1 in 1,000 people with epilepsy die from SUDEP. This is the leading cause of death in people with uncontrolled seizures. Until further answers are available, the best way to prevent SUDEP is to lower your risk by controlling seizures. Research has found that people with all types of epilepsy that experience convulsive seizures can be at risk.  Please make sure you are staying well hydrated. I recommend 50-60 ounces daily. Well balanced diet and regular exercise encouraged. Consistent sleep schedule with 6-8 hours recommended.   Please continue follow up with care team as directed.   Follow up with me in 1 year   You may receive a survey regarding today's visit. I encourage you to leave honest feed  back as I do use this information to improve patient care. Thank you for seeing me today!
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
## 2024-03-18 ENCOUNTER — Telehealth: Payer: BC Managed Care – PPO | Admitting: Family Medicine

## 2024-03-18 ENCOUNTER — Encounter: Payer: Self-pay | Admitting: Family Medicine

## 2024-03-18 DIAGNOSIS — G40209 Localization-related (focal) (partial) symptomatic epilepsy and epileptic syndromes with complex partial seizures, not intractable, without status epilepticus: Secondary | ICD-10-CM

## 2024-03-18 MED ORDER — LEVETIRACETAM 750 MG PO TABS: 750.0000 mg | ORAL_TABLET | Freq: Two times a day (BID) | ORAL | 4 refills | Status: AC

## 2025-04-08 ENCOUNTER — Telehealth: Admitting: Family Medicine
